# Patient Record
Sex: Female | Born: 1960 | Race: White | Hispanic: No | Smoking: Former smoker
Health system: Southern US, Community
[De-identification: ages and names within clinical notes are randomized; demographics above are authoritative.]

## PROBLEM LIST (undated history)

## (undated) DIAGNOSIS — Z860100 Personal history of colon polyps, unspecified: Secondary | ICD-10-CM

## (undated) DIAGNOSIS — F329 Major depressive disorder, single episode, unspecified: Secondary | ICD-10-CM

## (undated) DIAGNOSIS — H269 Unspecified cataract: Secondary | ICD-10-CM

## (undated) DIAGNOSIS — J302 Other seasonal allergic rhinitis: Secondary | ICD-10-CM

## (undated) DIAGNOSIS — K9041 Non-celiac gluten sensitivity: Secondary | ICD-10-CM

## (undated) DIAGNOSIS — T7840XA Allergy, unspecified, initial encounter: Secondary | ICD-10-CM

## (undated) DIAGNOSIS — B019 Varicella without complication: Secondary | ICD-10-CM

## (undated) HISTORY — DX: Non-celiac gluten sensitivity: K90.41

## (undated) HISTORY — DX: Other seasonal allergic rhinitis: J30.2

## (undated) HISTORY — DX: Allergy, unspecified, initial encounter: T78.40XA

## (undated) HISTORY — DX: Unspecified cataract: H26.9

## (undated) HISTORY — DX: Personal history of colon polyps, unspecified: Z86.0100

## (undated) HISTORY — PX: CATARACT EXTRACTION, BILATERAL: SHX1313

## (undated) HISTORY — PX: BREAST ENHANCEMENT SURGERY: SHX7

## (undated) HISTORY — PX: ABDOMINOPLASTY: SUR9

## (undated) HISTORY — DX: Major depressive disorder, single episode, unspecified: F32.9

## (undated) HISTORY — DX: Varicella without complication: B01.9

## (undated) HISTORY — DX: Personal history of colonic polyps: Z86.010

## (undated) HISTORY — PX: COLONOSCOPY: SHX174

---

## 1998-09-18 ENCOUNTER — Other Ambulatory Visit: Admission: RE | Admit: 1998-09-18 | Discharge: 1998-09-18 | Payer: Self-pay | Admitting: Obstetrics & Gynecology

## 1999-10-01 ENCOUNTER — Other Ambulatory Visit: Admission: RE | Admit: 1999-10-01 | Discharge: 1999-10-01 | Payer: Self-pay | Admitting: Obstetrics & Gynecology

## 2000-09-08 ENCOUNTER — Other Ambulatory Visit: Admission: RE | Admit: 2000-09-08 | Discharge: 2000-09-08 | Payer: Self-pay | Admitting: Obstetrics and Gynecology

## 2001-06-14 HISTORY — PX: PLACEMENT OF BREAST IMPLANTS: SHX6334

## 2001-09-08 ENCOUNTER — Other Ambulatory Visit: Admission: RE | Admit: 2001-09-08 | Discharge: 2001-09-08 | Payer: Self-pay | Admitting: Obstetrics and Gynecology

## 2002-09-13 ENCOUNTER — Other Ambulatory Visit: Admission: RE | Admit: 2002-09-13 | Discharge: 2002-09-13 | Payer: Self-pay | Admitting: Obstetrics and Gynecology

## 2003-06-15 DIAGNOSIS — F32A Depression, unspecified: Secondary | ICD-10-CM

## 2003-06-15 HISTORY — DX: Depression, unspecified: F32.A

## 2003-09-20 ENCOUNTER — Other Ambulatory Visit: Admission: RE | Admit: 2003-09-20 | Discharge: 2003-09-20 | Payer: Self-pay | Admitting: Obstetrics and Gynecology

## 2004-08-21 ENCOUNTER — Other Ambulatory Visit: Admission: RE | Admit: 2004-08-21 | Discharge: 2004-08-21 | Payer: Self-pay | Admitting: Obstetrics and Gynecology

## 2005-08-20 ENCOUNTER — Other Ambulatory Visit: Admission: RE | Admit: 2005-08-20 | Discharge: 2005-08-20 | Payer: Self-pay | Admitting: Obstetrics and Gynecology

## 2009-01-12 DIAGNOSIS — N870 Mild cervical dysplasia: Secondary | ICD-10-CM | POA: Insufficient documentation

## 2009-06-14 DIAGNOSIS — H269 Unspecified cataract: Secondary | ICD-10-CM

## 2009-06-14 HISTORY — DX: Unspecified cataract: H26.9

## 2009-06-14 HISTORY — PX: MOUTH SURGERY: SHX715

## 2012-11-21 ENCOUNTER — Ambulatory Visit: Payer: Self-pay | Admitting: Physician Assistant

## 2012-11-21 VITALS — BP 112/73 | HR 69 | Temp 98.0°F | Resp 16 | Ht 65.0 in | Wt 185.0 lb

## 2012-11-21 DIAGNOSIS — Z7189 Other specified counseling: Secondary | ICD-10-CM

## 2012-11-21 DIAGNOSIS — Z7184 Encounter for health counseling related to travel: Secondary | ICD-10-CM

## 2012-11-21 MED ORDER — ATOVAQUONE-PROGUANIL HCL 250-100 MG PO TABS: 1.0000 | ORAL_TABLET | Freq: Every day | ORAL | Status: DC

## 2012-11-21 NOTE — Progress Notes (Signed)
  Subjective:    Patient ID: Maureen Butler, female    DOB: 1960/06/26, 52 y.o.   MRN: [id number]  HPI 52 year old female presents for counseling for international travel. Leaving on 11/25/12 for 1 week in [place] for a medical mission trip. They have been on multiple mission trips in the past and have had Hepatitis A and Hepatitis B and typhoid in the past.  Patient otherwise healthy with no other concerns today.      Review of Systems  Constitutional: Negative for fever and chills.  Respiratory: Negative for cough.   Gastrointestinal: Negative for nausea and vomiting.       Objective:   Physical Exam  Constitutional: She is oriented to person, place, and time. She appears well-developed and well-nourished.  HENT:  Head: Normocephalic and atraumatic.  Right Ear: External ear normal.  Left Ear: External ear normal.  Eyes: Conjunctivae are normal.  Neck: Normal range of motion.  Cardiovascular: Normal rate.   Pulmonary/Chest: Effort normal.  Neurological: She is alert and oriented to person, place, and time.  Psychiatric: She has a normal mood and affect. Her behavior is normal. Judgment and thought content normal.          Assessment & Plan:  Encounter for counseling for travel  Rx for Malarone provided to start on 11/24/12, take daily while in [place], then for 7 days upon return Follow up as needed.

## 2013-09-11 ENCOUNTER — Other Ambulatory Visit: Payer: Self-pay

## 2013-09-11 DIAGNOSIS — Z1231 Encounter for screening mammogram for malignant neoplasm of breast: Secondary | ICD-10-CM

## 2013-10-01 ENCOUNTER — Ambulatory Visit
Admission: RE | Admit: 2013-10-01 | Discharge: 2013-10-01 | Disposition: A | Discharge status: Home / Self Care | Payer: 59 | Source: Ambulatory Visit

## 2013-10-01 DIAGNOSIS — Z1231 Encounter for screening mammogram for malignant neoplasm of breast: Secondary | ICD-10-CM

## 2014-08-08 ENCOUNTER — Ambulatory Visit (INDEPENDENT_AMBULATORY_CARE_PROVIDER_SITE_OTHER): Payer: No Typology Code available for payment source | Admitting: Emergency Medicine

## 2014-08-08 ENCOUNTER — Ambulatory Visit (INDEPENDENT_AMBULATORY_CARE_PROVIDER_SITE_OTHER): Payer: No Typology Code available for payment source

## 2014-08-08 VITALS — BP 110/66 | HR 56 | Temp 97.6°F | Resp 18 | Ht 65.0 in | Wt 171.0 lb

## 2014-08-08 DIAGNOSIS — M25572 Pain in left ankle and joints of left foot: Secondary | ICD-10-CM

## 2014-08-08 DIAGNOSIS — S93402A Sprain of unspecified ligament of left ankle, initial encounter: Secondary | ICD-10-CM | POA: Diagnosis not present

## 2014-08-08 MED ORDER — NAPROXEN SODIUM 550 MG PO TABS: 550.0000 mg | ORAL_TABLET | Freq: Two times a day (BID) | ORAL | Status: AC

## 2014-08-08 MED ORDER — HYDROCODONE-ACETAMINOPHEN 5-325 MG PO TABS: 1.0000 | ORAL_TABLET | ORAL | Status: DC | PRN

## 2014-08-08 NOTE — Patient Instructions (Signed)

## 2014-08-08 NOTE — Progress Notes (Signed)
[facility] [address] [place] [address] [phone]  Date:  08/08/2014   Name:  Maureen Butler   DOB:  [birth year]   MRN:  [id number]  PCP:  No PCP Per Patient    Chief Complaint: Ankle Injury   History of Present Illness:  Maureen Butler is a 54 y.o. very pleasant female patient who presents with the following:  Injured yesterday in slip on wet grass.  Twisted her ankle and has pain in medial ankle Worse with weight bearing and inversion Less with elevation and ice. No improvement with over the counter medications or other home remedies.  Denies other complaint or health concern today.   There are no active problems to display for this patient.   Past Medical History  Diagnosis Date  . Cataract     Past Surgical History  Procedure Laterality Date  . Cesarean section      History  Substance Use Topics  . Smoking status: Never Smoker   . Smokeless tobacco: Not on file  . Alcohol Use: No    Family History  Problem Relation Age of Onset  . Alcohol abuse Sister     No Known Allergies  Medication list has been reviewed and updated.  Current Outpatient Prescriptions on File Prior to Visit  Medication Sig Dispense Refill  . sertraline (ZOLOFT) 100 MG tablet Take 100 mg by mouth daily.    Marland Kitchen atovaquone-proguanil (MALARONE) 250-100 MG TABS Take 1 tablet by mouth daily. (Patient not taking: Reported on 08/08/2014) 16 tablet 0   No current facility-administered medications on file prior to visit.    Review of Systems:  As per HPI, otherwise negative.    Physical Examination: Filed Vitals:   08/08/14 1434  BP: 110/66  Pulse: 56  Temp: 97.6 F (36.4 C)  Resp: 18   Filed Vitals:   08/08/14 1434  Height: 5\' 5"  (1.651 m)  Weight: 171 lb (77.565 kg)   Body mass index is 28.46 kg/(m^2). Ideal Body Weight: Weight in (lb) to have BMI = 25: 149.9   GEN: WDWN, NAD, Non-toxic, Alert & Oriented x 3 HEENT: Atraumatic, Normocephalic.   Ears and Nose: No external deformity. EXTR: No clubbing/cyanosis/edema NEURO: Normal gait.  PSYCH: Normally interactive. Conversant. Not depressed or anxious appearing.  Calm demeanor.  Left ankle:  Tender and ecchymotic medial malleolus and midfoot.  No deformity.  Joint stable  Assessment and Plan: Sprain ankle Crutches WBAT Ankle brace vicodin Anaprox  Signed,  Ellison Carwin, MD   [facility] reading (PRIMARY) by  Dr. Ouida Sills.  Negative foot and ankle.

## 2014-12-10 ENCOUNTER — Ambulatory Visit (INDEPENDENT_AMBULATORY_CARE_PROVIDER_SITE_OTHER): Payer: No Typology Code available for payment source | Admitting: Family Medicine

## 2014-12-10 VITALS — BP 108/64 | HR 57 | Temp 97.8°F | Resp 16 | Ht 64.25 in | Wt 169.0 lb

## 2014-12-10 DIAGNOSIS — R1084 Generalized abdominal pain: Secondary | ICD-10-CM | POA: Diagnosis not present

## 2014-12-10 DIAGNOSIS — R197 Diarrhea, unspecified: Secondary | ICD-10-CM

## 2014-12-10 DIAGNOSIS — T753XXA Motion sickness, initial encounter: Secondary | ICD-10-CM

## 2014-12-10 LAB — POCT URINALYSIS DIPSTICK
Bilirubin, UA: NEGATIVE
Blood, UA: NEGATIVE
Glucose, UA: NEGATIVE
Ketones, UA: NEGATIVE
Leukocytes, UA: NEGATIVE
Nitrite, UA: NEGATIVE
PH UA: 5.5
Spec Grav, UA: 1.025
Urobilinogen, UA: 0.2

## 2014-12-10 LAB — POCT CBC
Granulocyte percent: 56.3 %G (ref 37–80)
HCT, POC: 43.5 % (ref 37.7–47.9)
HEMOGLOBIN: 14.1 g/dL (ref 12.2–16.2)
Lymph, poc: 2.4 (ref 0.6–3.4)
MCH: 28.9 pg (ref 27–31.2)
MCHC: 32.5 g/dL (ref 31.8–35.4)
MCV: 89.1 fL (ref 80–97)
MID (CBC): 0.6 (ref 0–0.9)
MPV: 6.4 fL (ref 0–99.8)
POC GRANULOCYTE: 3.9 (ref 2–6.9)
POC LYMPH %: 35.5 % (ref 10–50)
POC MID %: 8.2 % (ref 0–12)
Platelet Count, POC: 348 10*3/uL (ref 142–424)
RBC: 4.89 M/uL (ref 4.04–5.48)
RDW, POC: 12.7 %
WBC: 6.9 10*3/uL (ref 4.6–10.2)

## 2014-12-10 LAB — POCT UA - MICROSCOPIC ONLY
CASTS, UR, LPF, POC: NEGATIVE
CRYSTALS, UR, HPF, POC: NEGATIVE
MUCUS UA: NEGATIVE
YEAST UA: NEGATIVE

## 2014-12-10 MED ORDER — METRONIDAZOLE 500 MG PO TABS: 500.0000 mg | ORAL_TABLET | Freq: Three times a day (TID) | ORAL | Status: DC

## 2014-12-10 NOTE — Progress Notes (Signed)
Subjective:  Patient ID: Maureen Butler, female    DOB: [birth year]  Age: 54 y.o. MRN: [id number]  54 year old lady who 5 weeks ago was on a cruise around the [place]. She did not eat much of anything on land. The day before the cruise ended she developed diarrhea. She eventually took a course of ciprofloxacin after a couple of weeks, and the diarrhea cleared for that week. Shortly thereafter she redeveloped diarrhea, starting gradually and getting worse. Last night was very bad. She has had a lot of abdominal bloating and watery diarrhea. No fever. No other generalized illness. No cardiorespiratory symptoms. No GU symptoms. She is postmenopausal. No musculoskeletal or rash problems.  She does not know if anyone else from the cruise having symptoms, but really doesn't know of this from the cruise. She works as a Production designer, theatre/television/film at [employer]. Her partner has not been ill.  She takes some probiotics and around the course did take some Pepto-Bismol but didn't seem to help. She does not have a history of diarrheal disease, though she always moves her bowels on a frequent basis post eating. She is on sertraline.   Objective:   Pleasant alert healthy appearing lady. Throat clear. Neck without nodes. Chest clear. Heart regular without murmurs. Abdomen has relatively normal bowel sounds. Does not is up. Grossly distended at this time. Soft without organomegaly, masses, or significant tenderness morning. No rashes.  Assessment & Plan:   Assessment: Travel illness with post trip diarrhea, now recurrent with abdominal pain  Plan: CBC, urinalysis, stool culture, C. Difficile, giardia  Results for orders placed or performed in visit on 12/10/14  POCT CBC  Result Value Ref Range   WBC 6.9 4.6 - 10.2 K/uL   Lymph, poc 2.4 0.6 - 3.4   POC LYMPH PERCENT 35.5 10 - 50 %L   MID (cbc) 0.6 0 - 0.9   POC MID % 8.2 0 - 12 %M   POC Granulocyte 3.9 2 - 6.9   Granulocyte percent 56.3 37 - 80 %G   RBC 4.89  4.04 - 5.48 M/uL   Hemoglobin 14.1 12.2 - 16.2 g/dL   HCT, POC 43.5 37.7 - 47.9 %   MCV 89.1 80 - 97 fL   MCH, POC 28.9 27 - 31.2 pg   MCHC 32.5 31.8 - 35.4 g/dL   RDW, POC 12.7 %   Platelet Count, POC 348 142 - 424 K/uL   MPV 6.4 0 - 99.8 fL  POCT UA - Microscopic Only  Result Value Ref Range   WBC, Ur, HPF, POC 0-2    RBC, urine, microscopic 0-2    Bacteria, U Microscopic trace    Mucus, UA neg    Epithelial cells, urine per micros 1-4    Crystals, Ur, HPF, POC neg    Casts, Ur, LPF, POC neg    Yeast, UA neg   POCT urinalysis dipstick  Result Value Ref Range   Color, UA yellow    Clarity, UA clear    Glucose, UA neg    Bilirubin, UA neg    Ketones, UA neg    Spec Grav, UA 1.025    Blood, UA neg    pH, UA 5.5    Protein, UA trace    Urobilinogen, UA 0.2    Nitrite, UA neg    Leukocytes, UA Negative Negative   Patient was told that there are many things that could cause of diarrhea like this after traveling. Her symptoms sound suspicious for  Giardia, and will treat with metronidazole which would also help probably with C. difficile or amoeba. She is arty had a course of ciprofloxacin. If she is not doing better in the next week she is to return. Cautioned her about alcohol and metronidazole. Patient Instructions  Stay well hydrated  Take the metronidazole 500 mg 3 times daily with food  If diarrhea has not improved some in the next 48 hours, then you can go ahead and begin some over-the-counter Imodium also. Continue taking your probiotics.  At anytime if he developed bloody diarrhea or increasing abdominal pain she should come in to be checked sooner.  If you're not doing well over the next week or so advise coming back.     Ellison Leisure, MD 12/10/2014

## 2014-12-10 NOTE — Patient Instructions (Signed)
Stay well hydrated  Take the metronidazole 500 mg 3 times daily with food  If diarrhea has not improved some in the next 48 hours, then you can go ahead and begin some over-the-counter Imodium also. Continue taking your probiotics.  At anytime if he developed bloody diarrhea or increasing abdominal pain she should come in to be checked sooner.  If you're not doing well over the next week or so advise coming back.

## 2014-12-11 ENCOUNTER — Telehealth: Payer: Self-pay | Admitting: Radiology

## 2014-12-11 LAB — GIARDIA/CRYPTOSPORIDIUM (EIA)
Cryptosporidium Screen (EIA): NEGATIVE
Giardia Screen (EIA): NEGATIVE

## 2014-12-11 NOTE — Telephone Encounter (Signed)
Dr hopper- [facility] called--they were unable to do C-diff due to the stool turned in by the pt was formed, and they need it to be loose. Let us know if you would like [place] to do anything.

## 2014-12-12 ENCOUNTER — Telehealth: Payer: Self-pay | Admitting: Family Medicine

## 2014-12-12 NOTE — Telephone Encounter (Signed)
Tell patient if watery diarrhea recurs will need to re-submit a C-dif.

## 2014-12-12 NOTE — Telephone Encounter (Signed)
lmom for patient to give lab a call back

## 2014-12-14 LAB — STOOL CULTURE

## 2015-01-08 ENCOUNTER — Other Ambulatory Visit: Payer: Self-pay | Admitting: Emergency Medicine

## 2015-01-08 ENCOUNTER — Ambulatory Visit (INDEPENDENT_AMBULATORY_CARE_PROVIDER_SITE_OTHER): Payer: No Typology Code available for payment source | Admitting: Emergency Medicine

## 2015-01-08 VITALS — BP 114/68 | HR 68 | Temp 98.9°F | Resp 18 | Ht 64.0 in | Wt 173.0 lb

## 2015-01-08 DIAGNOSIS — A09 Infectious gastroenteritis and colitis, unspecified: Secondary | ICD-10-CM | POA: Diagnosis not present

## 2015-01-08 LAB — POCT CBC
GRANULOCYTE PERCENT: 49.8 % (ref 37–80)
HCT, POC: 41.7 % (ref 37.7–47.9)
HEMOGLOBIN: 14.1 g/dL (ref 12.2–16.2)
Lymph, poc: 3.4 (ref 0.6–3.4)
MCH, POC: 30 pg (ref 27–31.2)
MCHC: 33.8 g/dL (ref 31.8–35.4)
MCV: 88.5 fL (ref 80–97)
MID (CBC): 0.6 (ref 0–0.9)
MPV: 6.5 fL (ref 0–99.8)
PLATELET COUNT, POC: 323 10*3/uL (ref 142–424)
POC GRANULOCYTE: 3.9 (ref 2–6.9)
POC LYMPH PERCENT: 43.1 %L (ref 10–50)
POC MID %: 7.1 %M (ref 0–12)
RBC: 4.71 M/uL (ref 4.04–5.48)
RDW, POC: 12.9 %
WBC: 7.8 10*3/uL (ref 4.6–10.2)

## 2015-01-08 MED ORDER — CIPROFLOXACIN HCL 500 MG PO TABS: 500.0000 mg | ORAL_TABLET | Freq: Two times a day (BID) | ORAL | Status: DC

## 2015-01-08 NOTE — Progress Notes (Signed)
Subjective:  Patient ID: Maureen Butler, female    DOB: [birth year]  Age: 54 y.o. MRN: [id number]  CC: Follow-up   HPI Maureen Butler presents   With diarrhea. 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 She previous to that she was treated for diarrhea in June by Dr. Linna Darner after a trip to the [place] on a cruise ship. She was treated initially with Cipro and failed to improve then given Flagyl. She again improved well enough that she could travel to [place]. She had negative stool culture and stool ova and parasite examinations done at that time. She has no fever or chills she has some moderate pus in her stool denies any specific abdominal pain and cramps. She has no nausea or vomiting. She's not seen any visible parasites. She is very meticulous in her travels to avoid risk of fecal oral contamination. She's had no improvement with over-the-counter medication  History Maureen Butler has a past medical history of Cataract.   She has past surgical history that includes Cesarean section.   Her  family history includes Alcohol abuse in her sister.  She   reports that she has quit smoking. She does not have any smokeless tobacco history on file. She reports that she does not drink alcohol or use illicit drugs.  Outpatient Prescriptions Prior to Visit  Medication Sig Dispense Refill  . sertraline (ZOLOFT) 100 MG tablet Take 100 mg by mouth daily.    Maureen Butler Kitchen atovaquone-proguanil (MALARONE) 250-100 MG TABS Take 1 tablet by mouth daily. (Patient not taking: Reported on 08/08/2014) 16 tablet 0  . HYDROcodone-acetaminophen (NORCO) 5-325 MG per tablet Take 1-2 tablets by mouth every 4 (four) hours as needed. (Patient not taking: Reported on 12/10/2014) 30 tablet 0  . metroNIDAZOLE (FLAGYL) 500 MG tablet Take 1 tablet (500 mg total) by mouth 3 (three) times daily. (Patient not taking: Reported on 01/08/2015) 21 tablet 0  . naproxen sodium (ANAPROX DS) 550 MG tablet Take 1 tablet (550  mg total) by mouth 2 (two) times daily with a meal. (Patient not taking: Reported on 12/10/2014) 40 tablet 0   No facility-administered medications prior to visit.    History   Social History  . Marital Status: Married    Spouse Name: N/A  . Number of Children: N/A  . Years of Education: N/A   Social History Main Topics  . Smoking status: Former Research scientist (life sciences)  . Smokeless tobacco: Not on file  . Alcohol Use: No  . Drug Use: No  . Sexual Activity: Yes    Birth Control/ Protection: Abstinence   Other Topics Concern  . None   Social History Narrative     Review of Systems  Constitutional: Negative for fever, chills and appetite change.  HENT: Negative for congestion, ear pain, postnasal drip, sinus pressure and sore throat.   Eyes: Negative for pain and redness.  Respiratory: Negative for cough, shortness of breath and wheezing.   Cardiovascular: Negative for leg swelling.  Gastrointestinal: Negative for nausea, vomiting, abdominal pain, diarrhea, constipation and blood in stool.  Endocrine: Negative for polyuria.  Genitourinary: Negative for dysuria, urgency, frequency and flank pain.  Musculoskeletal: Negative for gait problem.  Skin: Negative for rash.  Neurological: Negative for weakness and headaches.  Psychiatric/Behavioral: Negative for confusion and decreased concentration. The patient is not nervous/anxious.     Objective:  BP 114/68 mmHg  Pulse 68  Temp(Src) 98.9 F (37.2 C)  Resp 18  Ht 5\' 4"  (1.626 m)  Wt  173 lb (78.472 kg)  BMI 29.68 kg/m2  SpO2 98%  Physical Exam  Constitutional: She is oriented to person, place, and time. She appears well-developed and well-nourished.  HENT:  Head: Normocephalic and atraumatic.  Eyes: Conjunctivae are normal. Pupils are equal, round, and reactive to light.  Pulmonary/Chest: Effort normal.  Musculoskeletal: She exhibits no edema.  Neurological: She is alert and oriented to person, place, and time.  Skin: Skin is dry.    Psychiatric: She has a normal mood and affect. Her behavior is normal. Thought content normal.      Assessment & Plan:   Sareen was seen today for follow-up.  Diagnoses and all orders for this visit:  Diarrhea, travelers' Orders: -     POCT CBC -     Ova and parasite examination -     Stool culture -     Ova and parasite examination -     Ova and parasite examination -     Clostridium difficile EIA -     Ambulatory referral to Gastroenterology  Other orders -     ciprofloxacin (CIPRO) 500 MG tablet; Take 1 tablet (500 mg total) by mouth 2 (two) times daily.   I am having [name] start on ciprofloxacin. I am also having her maintain her sertraline, atovaquone-proguanil, HYDROcodone-acetaminophen, naproxen sodium, and metroNIDAZOLE.  Meds ordered this encounter  Medications  . ciprofloxacin (CIPRO) 500 MG tablet    Sig: Take 1 tablet (500 mg total) by mouth 2 (two) times daily.    Dispense:  20 tablet    Refill:  0    I suggested to her that we repeat her ova parasites and stool culture get an C. Difficile titer and she's been in and been on antibiotic. And she's not had a colonoscopy and with his chronic recurrent diarrhea think is appropriate she be seen by by gastroenterologist and that referral is made.  Appropriate red flag conditions were discussed with the patient as well as actions that should be taken.  Patient expressed his understanding.  Follow-up: Return if symptoms worsen or fail to improve.  Roselee Culver, MD

## 2015-01-08 NOTE — Patient Instructions (Signed)

## 2015-01-09 LAB — C. DIFFICILE GDH AND TOXIN A/B
C. difficile GDH: NOT DETECTED
C. difficile Toxin A/B: NOT DETECTED

## 2015-01-10 LAB — OVA AND PARASITE EXAMINATION
OP: NONE SEEN
OP: NONE SEEN
OP: NONE SEEN

## 2015-01-13 LAB — STOOL CULTURE

## 2015-03-30 IMAGING — CR DG ANKLE COMPLETE 3+V*L*
3 series · 3 of 3 positions shown · non-contrast
Comparison: None.

CLINICAL DATA: Twisted ankle walking in the grass

EXAM:
LEFT ANKLE COMPLETE - 3+ VIEW

[AP]
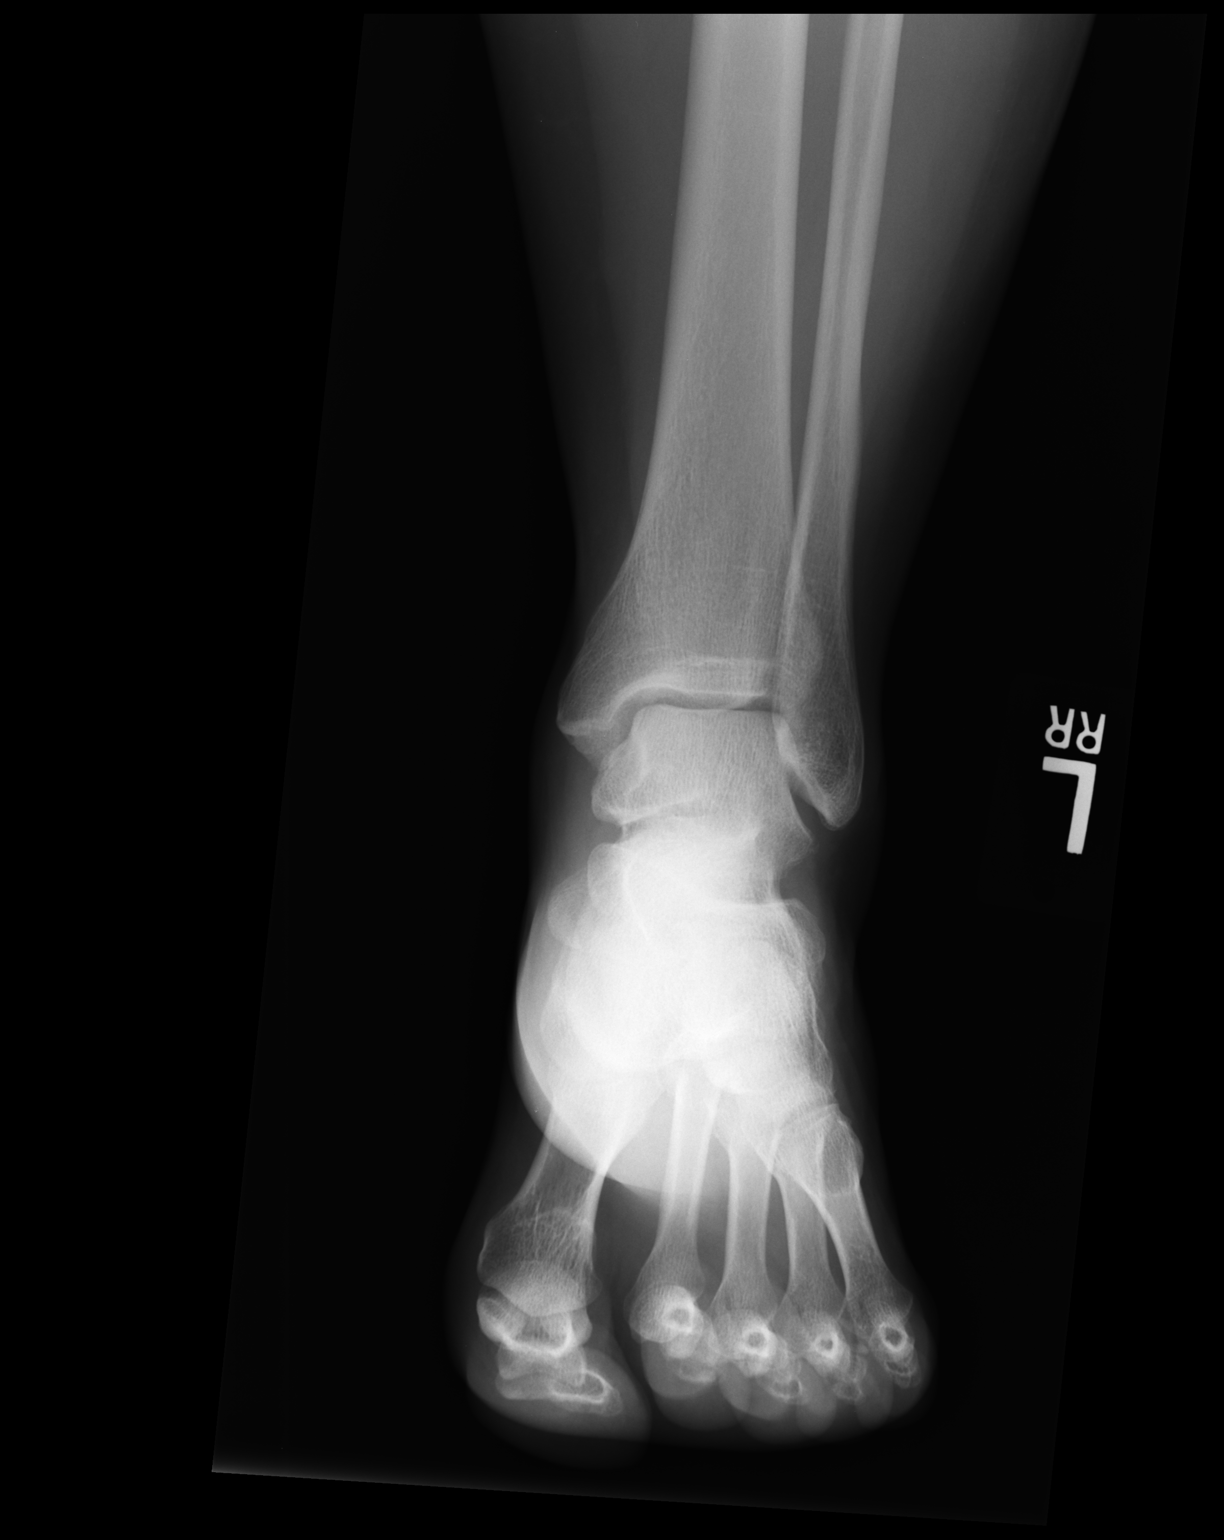

[ap obl int rot]
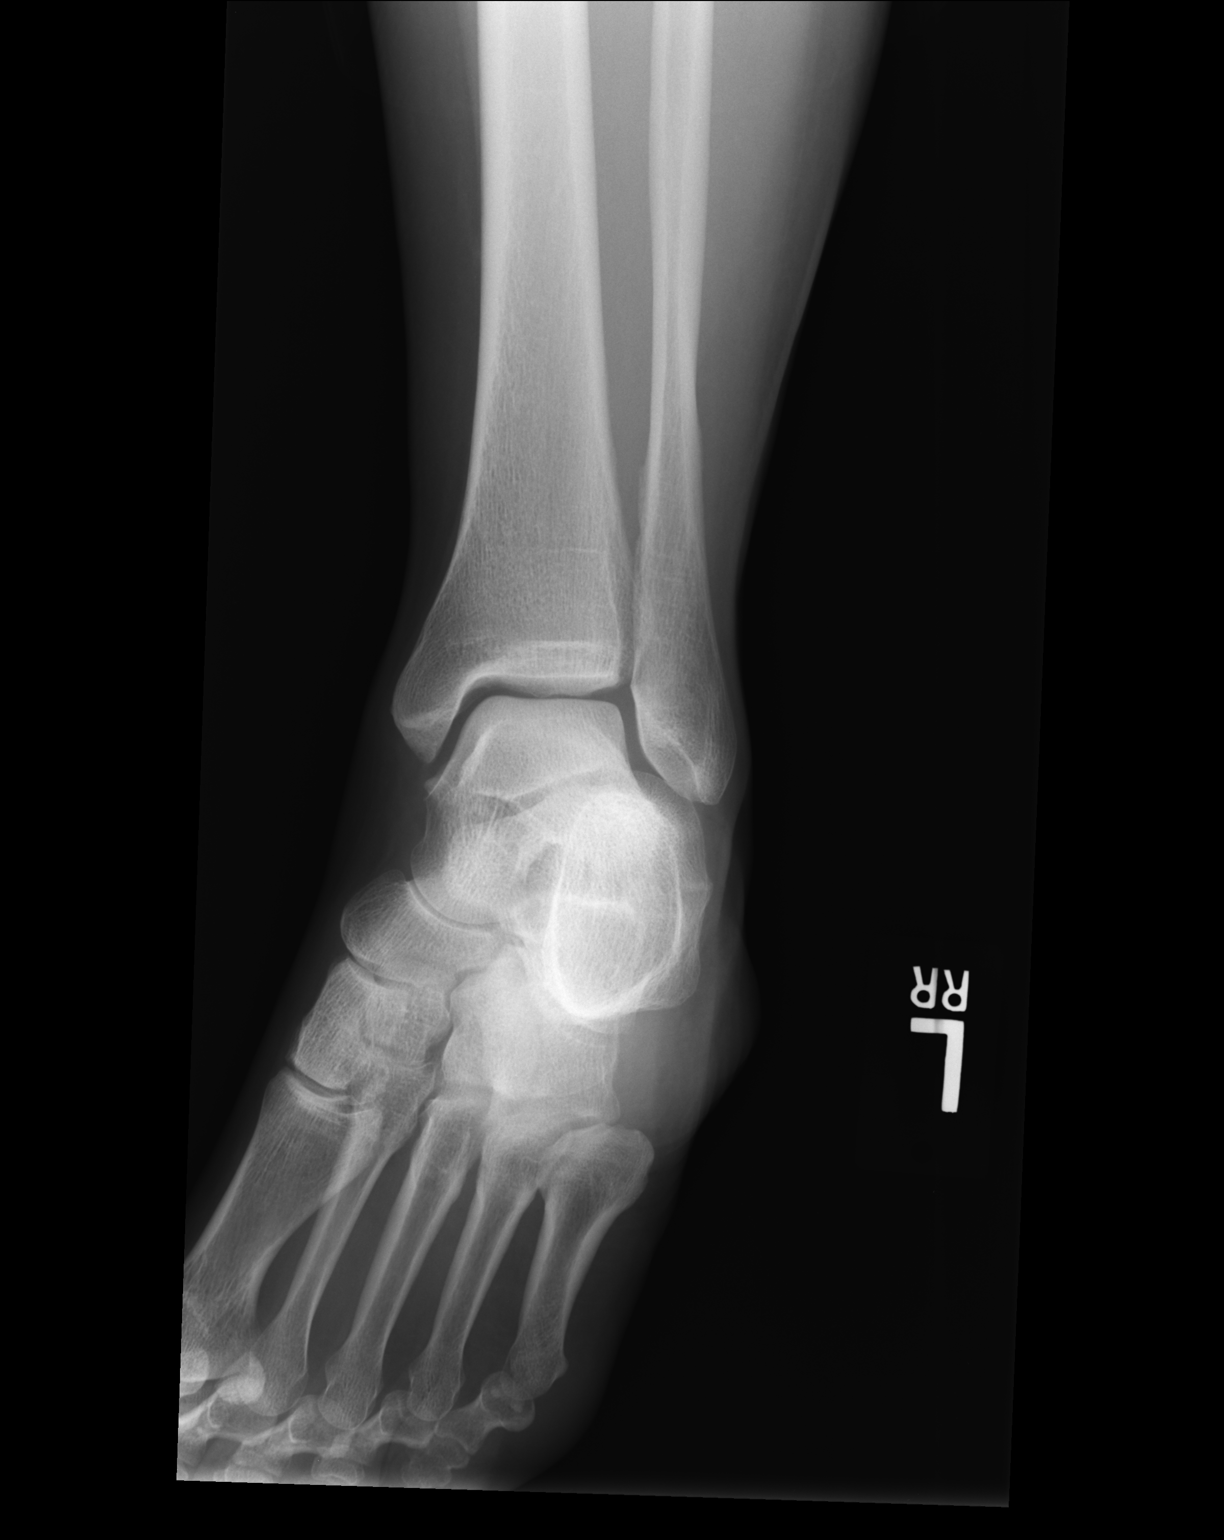

[lateral]
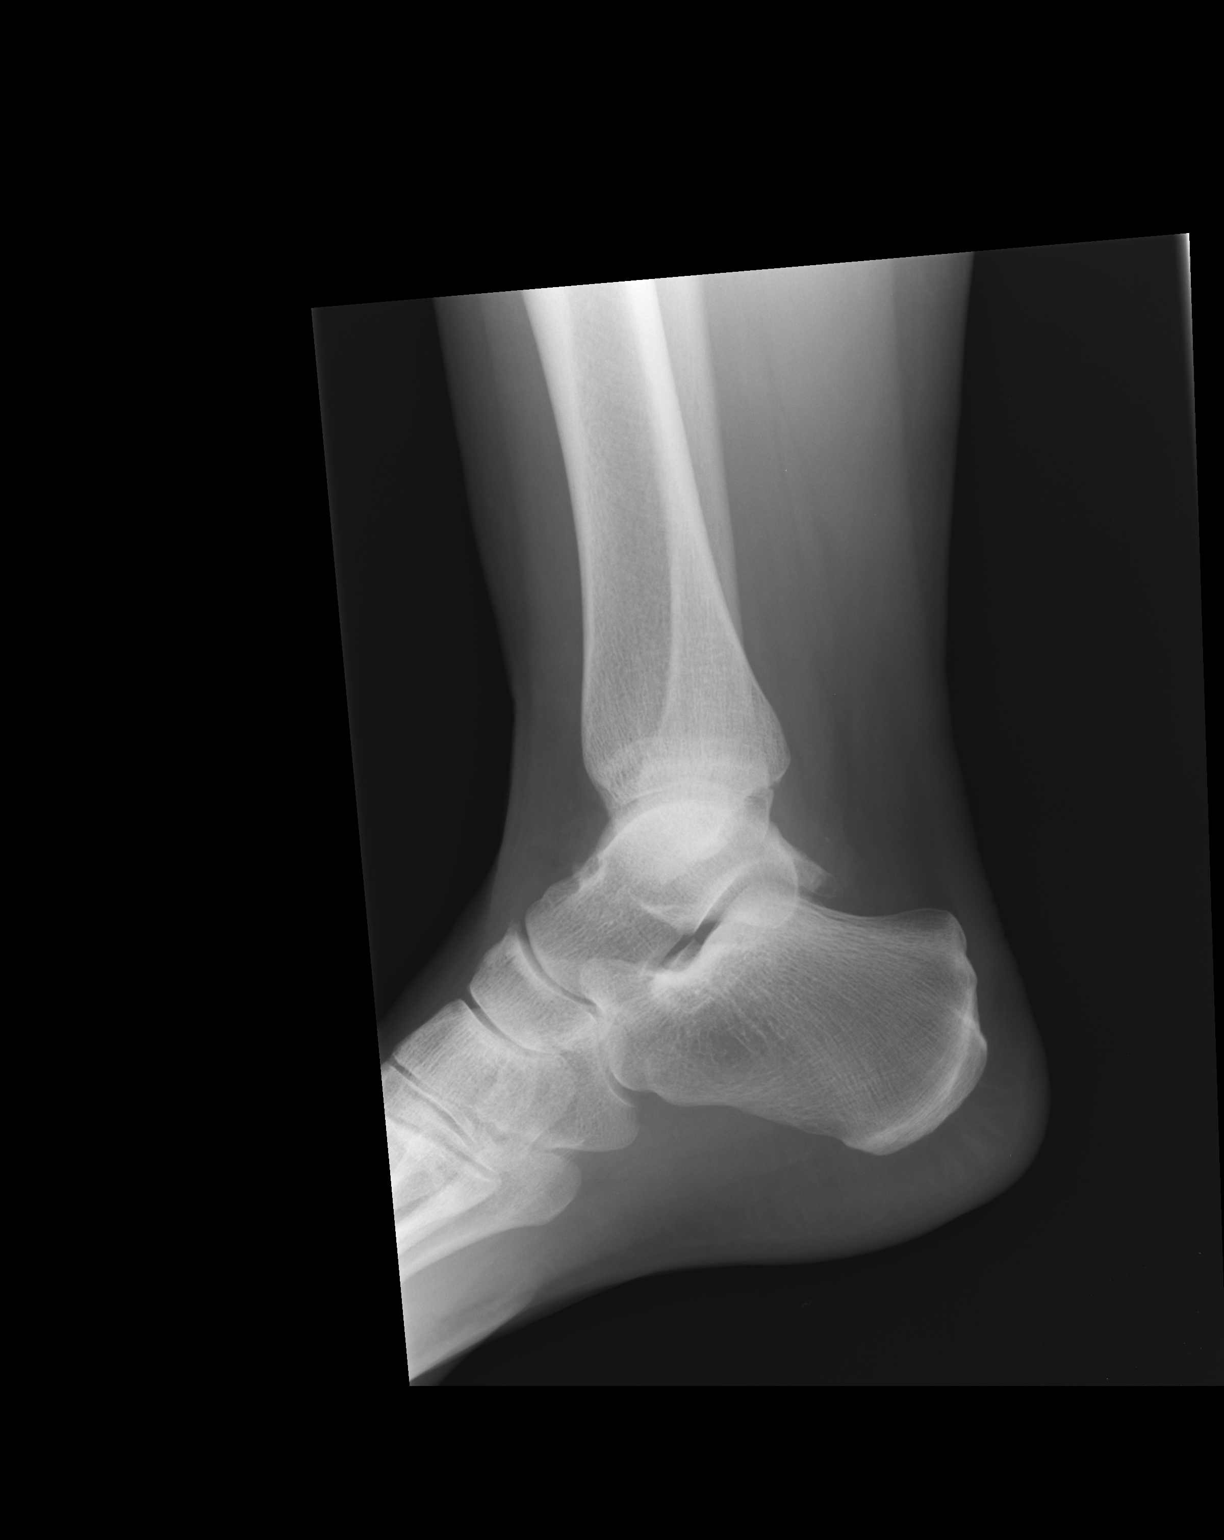

[3 of 3 positions shown; findings below may reference images not displayed]

FINDINGS: Three views of left ankle submitted. No acute fracture or
subluxation. No radiopaque foreign body. Ankle mortise is preserved.
IMPRESSION: Negative.

## 2015-04-16 DIAGNOSIS — Z0271 Encounter for disability determination: Secondary | ICD-10-CM

## 2016-11-04 LAB — HM PAP SMEAR: HM PAP: NEGATIVE

## 2016-11-04 LAB — HM MAMMOGRAPHY

## 2016-11-30 ENCOUNTER — Encounter: Payer: Self-pay | Admitting: Gastroenterology

## 2017-01-06 ENCOUNTER — Ambulatory Visit (AMBULATORY_SURGERY_CENTER): Payer: Self-pay | Admitting: *Deleted

## 2017-01-06 VITALS — Ht 64.0 in | Wt 179.0 lb

## 2017-01-06 DIAGNOSIS — Z1211 Encounter for screening for malignant neoplasm of colon: Secondary | ICD-10-CM

## 2017-01-06 MED ORDER — NA SULFATE-K SULFATE-MG SULF 17.5-3.13-1.6 GM/177ML PO SOLN: 1.0000 | Freq: Once | ORAL | 0 refills | Status: AC

## 2017-01-06 NOTE — Progress Notes (Signed)
Denies allergies to eggs or soy products. Denies complications with sedation or anesthesia. Denies O2 use. Denies use of diet or weight loss medications.  Emmi instructions given for colonoscopy.  

## 2017-01-20 ENCOUNTER — Ambulatory Visit (AMBULATORY_SURGERY_CENTER): Payer: PRIVATE HEALTH INSURANCE | Admitting: Gastroenterology

## 2017-01-20 ENCOUNTER — Encounter: Payer: Self-pay | Admitting: Gastroenterology

## 2017-01-20 VITALS — BP 103/56 | HR 48 | Temp 98.0°F | Resp 11 | Ht 64.0 in | Wt 173.0 lb

## 2017-01-20 DIAGNOSIS — Z800 Family history of malignant neoplasm of digestive organs: Secondary | ICD-10-CM

## 2017-01-20 DIAGNOSIS — Z1211 Encounter for screening for malignant neoplasm of colon: Secondary | ICD-10-CM | POA: Diagnosis not present

## 2017-01-20 DIAGNOSIS — Z1212 Encounter for screening for malignant neoplasm of rectum: Secondary | ICD-10-CM

## 2017-01-20 DIAGNOSIS — D123 Benign neoplasm of transverse colon: Secondary | ICD-10-CM | POA: Diagnosis not present

## 2017-01-20 MED ORDER — SODIUM CHLORIDE 0.9 % IV SOLN: 500.0000 mL | INTRAVENOUS | Status: AC

## 2017-01-20 NOTE — Progress Notes (Signed)
Pt's states no medical or surgical changes since previsit or office visit. maw 

## 2017-01-20 NOTE — Progress Notes (Signed)
Report to PACU, RN, vss, BBS= Clear.  

## 2017-01-20 NOTE — Progress Notes (Signed)
Called to room to assist during endoscopic procedure.  Patient ID and intended procedure confirmed with present staff. Received instructions for my participation in the procedure from the performing physician.  

## 2017-01-20 NOTE — Patient Instructions (Signed)
YOU HAD AN ENDOSCOPIC PROCEDURE TODAY AT [facility]:   Refer to the procedure report that was given to you for any specific questions about what was found during the examination.  If the procedure report does not answer your questions, please call your gastroenterologist to clarify.  If you requested that your care partner not be given the details of your procedure findings, then the procedure report has been included in a sealed envelope for you to review at your convenience later.  YOU SHOULD EXPECT: Some feelings of bloating in the abdomen. Passage of more gas than usual.  Walking can help get rid of the air that was put into your GI tract during the procedure and reduce the bloating. If you had a lower endoscopy (such as a colonoscopy or flexible sigmoidoscopy) you may notice spotting of blood in your stool or on the toilet paper. If you underwent a bowel prep for your procedure, you may not have a normal bowel movement for a few days.  Please Note:  You might notice some irritation and congestion in your nose or some drainage.  This is from the oxygen used during your procedure.  There is no need for concern and it should clear up in a day or so.  SYMPTOMS TO REPORT IMMEDIATELY:   Following lower endoscopy (colonoscopy or flexible sigmoidoscopy):  Excessive amounts of blood in the stool  Significant tenderness or worsening of abdominal pains  Swelling of the abdomen that is new, acute  Fever of 100F or higher   For urgent or emergent issues, a gastroenterologist can be reached at any hour by calling [phone].   DIET:  We do recommend a small meal at first, but then you may proceed to your regular diet.  Drink plenty of fluids but you should avoid alcoholic beverages for 24 hours.  ACTIVITY:  You should plan to take it easy for the rest of today and you should NOT DRIVE or use heavy machinery until tomorrow (because of the sedation medicines used during the test).     FOLLOW UP: Our staff will call the number listed on your records the next business day following your procedure to check on you and address any questions or concerns that you may have regarding the information given to you following your procedure. If we do not reach you, we will leave a message.  However, if you are feeling well and you are not experiencing any problems, there is no need to return our call.  We will assume that you have returned to your regular daily activities without incident.  If any biopsies were taken you will be contacted by phone or by letter within the next 1-3 weeks.  Please call us at [phone] if you have not heard about the biopsies in 3 weeks.    SIGNATURES/CONFIDENTIALITY: You and/or your care partner have signed paperwork which will be entered into your electronic medical record.  These signatures attest to the fact that that the information above on your After Visit Summary has been reviewed and is understood.  Full responsibility of the confidentiality of this discharge information lies with you and/or your care-partner.  No NSAIDS: aspirin, aleve, ibuprofen for two weeks after your procedure per Dr. Loletha Carrow.

## 2017-01-20 NOTE — Op Note (Signed)
[facility] Patient Name: Maureen Butler Procedure Date: 01/20/2017 12:55 PM MRN: [id number] Endoscopist: [place]. Loletha Carrow , MD Age: 56 Referring MD:  Date of Birth: 09-Oct-1960 Gender: Female Account #: [id number] Procedure:                Colonoscopy Indications:              Screening in patient at increased risk: Colorectal                            cancer in mother 82 or older, This is the patient's                            first colonoscopy Medicines:                Monitored Anesthesia Care Procedure:                Pre-Anesthesia Assessment:                           - Prior to the procedure, a History and Physical                            was performed, and patient medications and                            allergies were reviewed. The patient's tolerance of                            previous anesthesia was also reviewed. The risks                            and benefits of the procedure and the sedation                            options and risks were discussed with the patient.                            All questions were answered, and informed consent                            was obtained. Prior Anticoagulants: The patient has                            taken no previous anticoagulant or antiplatelet                            agents. ASA Grade Assessment: II - A patient with                            mild systemic disease. After reviewing the risks                            and benefits, the patient was deemed in  satisfactory condition to undergo the procedure.                           After obtaining informed consent, the colonoscope                            was passed under direct vision. Throughout the                            procedure, the patient's blood pressure, pulse, and                            oxygen saturations were monitored continuously. The                            Colonoscope was introduced through the  anus and                            advanced to the the cecum, identified by                            appendiceal orifice and ileocecal valve. The                            colonoscopy was performed without difficulty. The                            patient tolerated the procedure well. The quality                            of the bowel preparation was excellent. The                            ileocecal valve, appendiceal orifice, and rectum                            were photographed. The quality of the bowel                            preparation was evaluated using the BBPS [facility]                            Bowel Preparation Scale) with scores of: Right                            Colon = 3, Transverse Colon = 3 and Left Colon = 3.                            The total BBPS score equals 9. The bowel                            preparation used was SUPREP. Scope In: 1:13:59 PM Scope Out: 1:31:15 PM Scope Withdrawal Time: 0 hours 13 minutes 43 seconds  Total Procedure Duration: 0 hours 17 minutes 16 seconds  Findings:                 The perianal and digital rectal examinations were                            normal.                           A 12 mm polyp was found in the proximal transverse                            colon. The polyp was sessile. The polyp was removed                            with a hot snare. Resection and retrieval were                            complete.                           The exam was otherwise without abnormality on                            direct and retroflexion views. Complications:            No immediate complications. Estimated Blood Loss:     Estimated blood loss: none. Impression:               - One 12 mm polyp in the proximal transverse colon,                            removed with a hot snare. Resected and retrieved.                           - The examination was otherwise normal on direct                            and retroflexion  views. Recommendation:           - Patient has a contact number available for                            emergencies. The signs and symptoms of potential                            delayed complications were discussed with the                            patient. Return to normal activities tomorrow.                            Written discharge instructions were provided to the                            patient.                           -  Resume previous diet.                           - No aspirin, ibuprofen, naproxen, or other                            non-steroidal anti-inflammatory drugs for 7 days                            after polyp removal.                           - Await pathology results.                           - Repeat colonoscopy is recommended for                            surveillance. The colonoscopy date will be                            determined after pathology results from today's                            exam become available for review.  L. Loletha Carrow, MD 01/20/2017 1:36:17 PM This report has been signed electronically.

## 2017-01-21 ENCOUNTER — Telehealth: Payer: Self-pay

## 2017-01-21 NOTE — Telephone Encounter (Signed)
Name identifier, left a message 2nd call attempt.

## 2017-01-26 ENCOUNTER — Encounter: Payer: Self-pay | Admitting: Gastroenterology

## 2017-02-09 DIAGNOSIS — K635 Polyp of colon: Secondary | ICD-10-CM | POA: Insufficient documentation

## 2018-01-27 ENCOUNTER — Encounter: Payer: Self-pay | Admitting: Physician Assistant

## 2018-01-27 ENCOUNTER — Ambulatory Visit (INDEPENDENT_AMBULATORY_CARE_PROVIDER_SITE_OTHER): Payer: PRIVATE HEALTH INSURANCE | Admitting: Physician Assistant

## 2018-01-27 VITALS — BP 110/80 | HR 51 | Temp 98.4°F | Ht 63.5 in | Wt 170.0 lb

## 2018-01-27 DIAGNOSIS — Z7689 Persons encountering health services in other specified circumstances: Secondary | ICD-10-CM | POA: Diagnosis not present

## 2018-01-27 DIAGNOSIS — K9041 Non-celiac gluten sensitivity: Secondary | ICD-10-CM | POA: Diagnosis not present

## 2018-01-27 DIAGNOSIS — F320 Major depressive disorder, single episode, mild: Secondary | ICD-10-CM | POA: Diagnosis not present

## 2018-01-27 NOTE — Progress Notes (Signed)
Maureen Butler is a 57 y.o. female here for an establish care visit.  History of Present Illness:   Chief Complaint  Patient presents with  . New Patient (Initial Visit)  . Establish Care   Gluten intolerance -- 3 years ago went on [place] trip, then 1 week later went on cruise, and gut has never been the same since. She put herself on an elimination diet and finds that she no longer can tolerate gluten -- causes severe bloating and pain. Depression -- denies current SI/HI. Currently doing well on Zoloft 50 mg.  Health Maintenance: Immunizations -- states she is UTD Colonoscopy -- August 2018, repeat q 3 years Mammogram -- UTD on mammogram PAP -- most recent Bone Density -- has not had in the past Weight -- Weight: 170 lb (77.1 kg)   Depression screen PHQ 2/9 01/08/2015  Decreased Interest 0  Down, Depressed, Hopeless 0  PHQ - 2 Score 0    No flowsheet data found.   Other providers/specialists: [facility]   Past Medical History:  Diagnosis Date  . Allergy   . Cataract 2011   bilateral eyes  . Chicken pox   . Depression 2005   dx with PMDD initially, but thought more menopausal, was SI/HI, never admitted to [facility], was on Zoloft 100 mg  . Gluten intolerance    3 years ago -- [place] and gut has never been the same since; gas, bloating, abdominal pain, brain fog  . History of colon polyps   . Seasonal allergies      Social History   Socioeconomic History  . Marital status: Divorced    Spouse name: Not on file  . Number of children: Not on file  . Years of education: Not on file  . Highest education level: Not on file  Occupational History  . Occupation: Mental Health Therapist  Social Needs  . Financial resource strain: Not on file  . Food insecurity:    Worry: Not on file    Inability: Not on file  . Transportation needs:    Medical: Not on file    Non-medical: Not on file  Tobacco Use  . Smoking status: Former Smoker    Last  attempt to quit: 06/14/1992    Years since quitting: 25.6  . Smokeless tobacco: Never Used  Substance and Sexual Activity  . Alcohol use: No    Alcohol/week: 0.0 standard drinks  . Drug use: No  . Sexual activity: Yes    Birth control/protection: Abstinence  Lifestyle  . Physical activity:    Days per week: Not on file    Minutes per session: Not on file  . Stress: Not on file  Relationships  . Social connections:    Talks on phone: Not on file    Gets together: Not on file    Attends religious service: Not on file    Active member of club or organization: Not on file    Attends meetings of clubs or organizations: Not on file    Relationship status: Not on file  . Intimate partner violence:    Fear of current or ex partner: Not on file    Emotionally abused: Not on file    Physically abused: Not on file    Forced sexual activity: Not on file  Other Topics Concern  . 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 BREAST ENHANCEMENT SURGERY    . CATARACT EXTRACTION, BILATERAL    . CESAREAN SECTION  1994  . 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 PLACEMENT OF BREAST IMPLANTS  2003    Family History  Problem Relation Age of Onset  . Colon cancer Mother 79       died age 76, late stages, did metastasize  . Alcohol abuse Mother   . Alcohol abuse Father   . Early death Father 69  . Aplastic anemia Father   . Alcohol abuse Sister   . Depression Sister   . Mental illness Sister   . Alcohol abuse Sister   . Depression Sister   . Alcohol abuse Brother   . Depression Brother   . Drug abuse Brother   . Mental illness Brother   . Breast cancer Maternal Grandmother 45  . Alcohol  abuse Maternal Grandfather   . Cancer Maternal Grandfather        Patient unsure of what kind, ?lung  . Alcohol abuse Brother   . Depression Brother   . Drug abuse Brother   . Mental illness Brother   . Esophageal cancer Neg Hx   . Rectal cancer Neg Hx   . Stomach cancer Neg Hx   . Pancreatic cancer Neg Hx     No Known Allergies   Current Medications:   Current Outpatient Medications:  .  sertraline (ZOLOFT) 50 MG tablet, Take 50 mg by mouth daily. , Disp: , Rfl:   Current Facility-Administered Medications:  .  0.9 %  sodium chloride infusion, 500 mL, Intravenous, Continuous, Danis, Estill Cotta III, MD   Review of Systems:   ROS  Vitals:   Vitals:   01/27/18 0905  BP: 110/80  Pulse: (!) 51  Temp: 98.4 F (36.9 C)  TempSrc: Oral  SpO2: 94%  Weight: 170 lb (77.1 kg)  Height: 5' 3.5" (1.613 m)     Body mass index is 29.64 kg/m.  Physical Exam:   Physical Exam  Constitutional: She appears well-developed. She is cooperative.  Non-toxic appearance. She does not have a sickly appearance. She does not appear ill. No distress.  Cardiovascular: Normal rate, regular rhythm, S1 normal, S2 normal, normal heart sounds and normal pulses.  No LE edema  Pulmonary/Chest: Effort normal and breath sounds normal.  Neurological: She is alert. GCS eye subscore is 4. GCS verbal subscore is 5. GCS motor subscore is 6.  Skin: Skin is warm, dry and intact.  Psychiatric: She has a normal mood and affect. Her speech is normal and behavior is normal.  Nursing note and vitals reviewed.   Assessment and Plan:    [name] was seen today for new patient (initial visit) and establish care.  Diagnoses and all orders for this visit:  Encounter to establish care  Depression, major, single episode, mild (HCC) Stable. Denies need for medication refills at this time.  Gluten intolerance Stable with dietary restrictions.  Follow-up for CPE at patient's convenience.   . Reviewed  expectations re: course of current medical issues. . Discussed self-management of symptoms. . Outlined signs and symptoms indicating need for more acute intervention. . Patient verbalized understanding and all questions were answered. . See orders for this visit as documented in  the electronic medical record. . Patient received an After-Visit Summary.   Inda Coke, PA-C

## 2018-01-27 NOTE — Patient Instructions (Signed)
It was great to see you!  Let's follow-up at your convenience for a physical, sooner if you have concerns.  Take care,  Niyam Bisping PA-C  

## 2018-02-06 ENCOUNTER — Encounter: Payer: Self-pay | Admitting: Physician Assistant

## 2018-03-01 DIAGNOSIS — E162 Hypoglycemia, unspecified: Secondary | ICD-10-CM | POA: Insufficient documentation

## 2018-03-01 DIAGNOSIS — Z87891 Personal history of nicotine dependence: Secondary | ICD-10-CM | POA: Insufficient documentation

## 2018-03-01 DIAGNOSIS — F3281 Premenstrual dysphoric disorder: Secondary | ICD-10-CM | POA: Insufficient documentation

## 2018-03-01 NOTE — Progress Notes (Deleted)
I acted as a Education administrator for [employer], PA-C Anselmo Pickler, LPN   Subjective:    Maureen Butler is a 57 y.o. female and is here for a comprehensive physical exam.   HPI  Health Maintenance Due  Topic Date Due  . TETANUS/TDAP  04/21/1981    Acute Concerns:   Chronic Issues:   Health Maintenance: Immunizations -- *** Colonoscopy -- *** Mammogram -- *** PAP -- *** Bone Density -- *** Diet -- *** Caffeine intake -- *** Sleep habits -- *** Exercise -- *** Weight --    Mood -- *** Weight history: Wt Readings from Last 10 Encounters:  01/27/18 170 lb (77.1 kg)  01/20/17 173 lb (78.5 kg)  01/06/17 179 lb (81.2 kg)  01/08/15 173 lb (78.5 kg)  12/10/14 169 lb (76.7 kg)  08/08/14 171 lb (77.6 kg)  11/21/12 185 lb (83.9 kg)   No LMP recorded. Patient is postmenopausal. Period characteristics: Alcohol use: Tobacco use:  Depression screen PHQ 2/9 01/08/2015  Decreased Interest 0  Down, Depressed, Hopeless 0  PHQ - 2 Score 0     Other providers/specialists: ***   PMHx, SurgHx, SocialHx, Medications, and Allergies were reviewed in the Visit Navigator and updated as appropriate.   Past Medical History:  Diagnosis Date  . Allergy   . Cataract 2011   bilateral eyes  . Chicken pox   . Depression 2005   dx with PMDD initially, but thought more menopausal, was SI/HI, never admitted to [facility], was on Zoloft 100 mg  . Gluten intolerance    3 years ago -- went [place] and gut has never been the same since; gas, bloating, abdominal pain, brain fog  . History of colon polyps   . Seasonal allergies     Past Surgical History:  Procedure Laterality Date  . BREAST ENHANCEMENT SURGERY    . CATARACT EXTRACTION, BILATERAL    . CESAREAN SECTION  1994  . 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 PLACEMENT OF BREAST IMPLANTS  2003    Family History  Problem Relation Age of Onset  . Colon cancer Mother 8       died age 48, late stages, did metastasize  .  Alcohol abuse Mother   . Alcohol abuse Father   . Early death Father 76  . Aplastic anemia Father   . Alcohol abuse Sister   . Depression Sister   . Mental illness Sister   . Alcohol abuse Sister   . Depression Sister   . Alcohol abuse Brother   . Depression Brother   . Drug abuse Brother   . Mental illness Brother   . Breast cancer Maternal Grandmother 40  . Alcohol abuse Maternal Grandfather   . Cancer Maternal Grandfather        Patient unsure of what kind, ?lung  . Alcohol abuse Brother   . Depression Brother   . Drug abuse Brother   . Mental illness Brother   . Esophageal cancer Neg Hx   . Rectal cancer Neg Hx   . Stomach cancer Neg Hx   . Pancreatic cancer Neg Hx     Social History   Tobacco Use  . Smoking status: Former Smoker    Last attempt to quit: 06/14/1992    Years since quitting: 25.7  . Smokeless tobacco: Never Used  Substance Use Topics  . Alcohol use: No    Alcohol/week: 0.0 standard drinks  . Drug use: No    Review  of Systems:   ROS  Objective:   There were no vitals taken for this visit. There is no height or weight on file to calculate BMI.   General Appearance:    Alert, cooperative, no distress, appears stated age  Head:    Normocephalic, without obvious abnormality, atraumatic  Eyes:    PERRL, conjunctiva/corneas clear, EOM's intact, fundi    benign, both eyes  Ears:    Normal TM's and external ear canals, both ears  Nose:   Nares normal, septum midline, mucosa normal, no drainage    or sinus tenderness  Throat:   Lips, mucosa, and tongue normal; teeth and gums normal  Neck:   Supple, symmetrical, trachea midline, no adenopathy;    thyroid:  no enlargement/tenderness/nodules; no carotid   bruit or JVD  Back:     Symmetric, no curvature, ROM normal, no CVA tenderness  Lungs:     Clear to auscultation bilaterally, respirations unlabored  Chest Wall:    No tenderness or deformity   Heart:    Regular rate and rhythm, S1 and S2 normal, no  murmur, rub   or gallop  Breast Exam:    No tenderness, masses, or nipple abnormality  Abdomen:     Soft, non-tender, bowel sounds active all four quadrants,    no masses, no organomegaly  Genitalia:    Normal female without lesion, discharge or tenderness  Extremities:   Extremities normal, atraumatic, no cyanosis or edema  Pulses:   2+ and symmetric all extremities  Skin:   Skin color, texture, turgor normal, no rashes or lesions  Lymph nodes:   Cervical, supraclavicular, and axillary nodes normal  Neurologic:   CNII-XII intact, normal strength, sensation and reflexes    throughout    Assessment/Plan:   There are no diagnoses linked to this encounter.   Well Adult Exam: Labs ordered: {Yes/No:18319::"Yes"}. Patient counseling was done. See below for items discussed. Discussed the patient's BMI.  The BMI {BMI plan (MU NQF measure 421):19504} Follow up {follow-up interval:13814}. Breast cancer screening: ***. Cervical cancer screening: ***   Patient Counseling: [x]    Nutrition: Stressed importance of moderation in sodium/caffeine intake, saturated fat and cholesterol, caloric balance, sufficient intake of fresh fruits, vegetables, fiber, calcium, iron, and 1 mg of folate supplement per day (for females capable of pregnancy).  [x]    Stressed the importance of regular exercise.   [x]    Substance Abuse: Discussed cessation/primary prevention of tobacco, alcohol, or other drug use; driving or other dangerous activities under the influence; availability of treatment for abuse.   [x]    Injury prevention: Discussed safety belts, safety helmets, smoke detector, smoking near bedding or upholstery.   [x]    Sexuality: Discussed sexually transmitted diseases, partner selection, use of condoms, avoidance of unintended pregnancy  and contraceptive alternatives.  [x]    Dental health: Discussed importance of regular tooth brushing, flossing, and dental visits.  [x]    Health maintenance and immunizations  reviewed. Please refer to Health maintenance section.   ***  Inda Coke, PA-C [place]

## 2018-03-01 NOTE — Progress Notes (Signed)
Subjective:    Maureen Butler is a 57 y.o. female and is here for a comprehensive physical exam.   HPI  There are no preventive care reminders to display for this patient.  Acute Concerns: None  Chronic Issues: Depression -- Currently on Zoloft 50 mg. Feels stable. Denies SI/HI. Former smoker -- Remains smoke free!  Health Maintenance: Immunizations -- will get flu shot at work Colonoscopy -- August 2018, repeat q 3 years Mammogram -- overdue on mammogram PAP -- most recent 11/09/16 normal Bone Density -- has not had in the past Diet -- gluten-intolerant; avoids lots of processed foods, mostly chicken, no fried foods, no sodas or juices Caffeine intake -- coffee daily Sleep habits -- sleep like a champ Exercise -- teaches at club fitness Weight -- Weight: 170 lb (77.1 kg) , plan is to get 160 lb Mood -- good Weight history: Wt Readings from Last 10 Encounters:  03/02/18 170 lb (77.1 kg)  01/27/18 170 lb (77.1 kg)  01/20/17 173 lb (78.5 kg)  01/06/17 179 lb (81.2 kg)  01/08/15 173 lb (78.5 kg)  12/10/14 169 lb (76.7 kg)  08/08/14 171 lb (77.6 kg)  11/21/12 185 lb (83.9 kg)   No LMP recorded. Patient is postmenopausal. Alcohol use: none Tobacco use: former, quit in 1994  Depression screen PHQ 2/9 03/02/2018  Decreased Interest 0  Down, Depressed, Hopeless 0  PHQ - 2 Score 0   Other providers/specialists: Ob-Gyn   PMHx, SurgHx, SocialHx, Medications, and Allergies were reviewed in the Visit Navigator and updated as appropriate.   Past Medical History:  Diagnosis Date  . Allergy   . Cataract 2011   bilateral eyes  . Chicken pox   . Depression 2005   dx with PMDD initially, but thought more menopausal, was SI/HI, never admitted to [facility], was on Zoloft 100 mg  . Gluten intolerance    3 years ago -- [place] and gut has never been the same since; gas, bloating, abdominal pain, brain fog  . History of colon polyps   . Seasonal allergies       Past Surgical History:  Procedure Laterality Date  . BREAST ENHANCEMENT SURGERY    . CATARACT EXTRACTION, BILATERAL    . CESAREAN SECTION  1994  . 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 PLACEMENT OF BREAST IMPLANTS  2003     Family History  Problem Relation Age of Onset  . Colon cancer Mother 39       died age 37, late stages, did metastasize  . Alcohol abuse Mother   . Alcohol abuse Father   . Early death Father 53  . Aplastic anemia Father   . Alcohol abuse Sister   . Depression Sister   . Mental illness Sister   . Alcohol abuse Sister   . Depression Sister   . Alcohol abuse Brother   . Depression Brother   . Drug abuse Brother   . Mental illness Brother   . Breast cancer Maternal Grandmother 70  . Alcohol abuse Maternal Grandfather   . Cancer Maternal Grandfather        Patient unsure of what kind, ?lung  . Alcohol abuse Brother   . Depression Brother   . Drug abuse Brother   . Mental illness Brother   . Esophageal cancer Neg Hx   . Rectal cancer Neg Hx   . Stomach cancer Neg Hx   . Pancreatic cancer Neg Hx  Social History   Tobacco Use  . Smoking status: Former Smoker    Last attempt to quit: 06/14/1992    Years since quitting: 25.7  . Smokeless tobacco: Never Used  Substance Use Topics  . Alcohol use: No    Alcohol/week: 0.0 standard drinks  . Drug use: No    Review of Systems:   Review of Systems  Constitutional: Negative for chills, fever, malaise/fatigue and weight loss.  HENT: Negative for hearing loss, sinus pain and sore throat.   Respiratory: Negative for cough and hemoptysis.   Cardiovascular: Negative for chest pain, palpitations, leg swelling and PND.  Gastrointestinal: Negative for abdominal pain, constipation, diarrhea, heartburn, nausea and vomiting.  Genitourinary: Negative for dysuria, frequency and urgency.  Musculoskeletal: Negative for back pain, myalgias and neck pain.  Skin: Negative for itching and rash.  Neurological:  Negative for dizziness, tingling, seizures and headaches.  Endo/Heme/Allergies: Negative for polydipsia.  Psychiatric/Behavioral: Negative for depression. The patient is not nervous/anxious.     Objective:   BP 110/70 (BP Location: Left Arm, Patient Position: Sitting, Cuff Size: Normal)   Pulse (!) 51   Temp 97.9 F (36.6 C) (Oral)   Ht 5\' 4"  (1.626 m)   Wt 170 lb (77.1 kg)   SpO2 97%   BMI 29.18 kg/m  Body mass index is 29.18 kg/m.   General Appearance:    Alert, cooperative, no distress, appears stated age  Head:    Normocephalic, without obvious abnormality, atraumatic  Eyes:    PERRL, conjunctiva/corneas clear, EOM's intact, fundi    benign, both eyes  Ears:    Normal TM's and external ear canals, both ears  Nose:   Nares normal, septum midline, mucosa normal, no drainage    or sinus tenderness  Throat:   Lips, mucosa, and tongue normal; teeth and gums normal  Neck:   Supple, symmetrical, trachea midline, no adenopathy;    thyroid:  no enlargement/tenderness/nodules; no carotid   bruit or JVD  Back:     Symmetric, no curvature, ROM normal, no CVA tenderness  Lungs:     Clear to auscultation bilaterally, respirations unlabored  Chest Wall:    No tenderness or deformity   Heart:    Regular rate and rhythm, S1 and S2 normal, no murmur, rub   or gallop  Breast Exam:    Deferred  Abdomen:     Soft, non-tender, bowel sounds active all four quadrants,    no masses, no organomegaly  Genitalia:    Deferred  Extremities:   Extremities normal, atraumatic, no cyanosis or edema  Pulses:   2+ and symmetric all extremities  Skin:   Skin color, texture, turgor normal, no rashes or lesions  Lymph nodes:   Cervical, supraclavicular, and axillary nodes normal  Neurologic:   CNII-XII intact, normal strength, sensation and reflexes    throughout    Assessment/Plan:   Cimone was seen today for annual exam.  Diagnoses and all orders for this visit:  Routine physical  examination Today patient counseled on age appropriate routine health concerns for screening and prevention, each reviewed and up to date or declined. Immunizations reviewed and up to date or declined. Labs ordered and reviewed. Risk factors for depression reviewed and negative. Hearing function and visual acuity are intact. ADLs screened and addressed as needed. Functional ability and level of safety reviewed and appropriate. Education, counseling and referrals performed based on assessed risks today. Patient provided with a copy of personalized plan for preventive services.  Encounter  for lipid screening for cardiovascular disease -     Lipid panel  Former smoker Remains smoke free! -     CBC with Differential/Platelet -     Comprehensive metabolic panel  Depression, major, single episode, mild (HCC) Well controlled. Continue Zoloft 50 mg. Follow-up in 6 months, sooner if needed. -     TSH    Well Adult Exam: Labs ordered: Yes. Patient counseling was done. See below for items discussed. Discussed the patient's BMI.  The BMI BMI is not in the acceptable range; BMI management plan is completed Follow up as needed for acute illness. Breast cancer screening: pending. Cervical cancer screening: UTD   Patient Counseling: [x]    Nutrition: Stressed importance of moderation in sodium/caffeine intake, saturated fat and cholesterol, caloric balance, sufficient intake of fresh fruits, vegetables, fiber, calcium, iron, and 1 mg of folate supplement per day (for females capable of pregnancy).  [x]    Stressed the importance of regular exercise.   [x]    Substance Abuse: Discussed cessation/primary prevention of tobacco, alcohol, or other drug use; driving or other dangerous activities under the influence; availability of treatment for abuse.   [x]    Injury prevention: Discussed safety belts, safety helmets, smoke detector, smoking near bedding or upholstery.   [x]    Sexuality: Discussed sexually transmitted  diseases, partner selection, use of condoms, avoidance of unintended pregnancy  and contraceptive alternatives.  [x]    Dental health: Discussed importance of regular tooth brushing, flossing, and dental visits.  [x]    Health maintenance and immunizations reviewed. Please refer to Health maintenance section.   Inda Coke, PA-C [place]

## 2018-03-02 ENCOUNTER — Ambulatory Visit (INDEPENDENT_AMBULATORY_CARE_PROVIDER_SITE_OTHER): Payer: PRIVATE HEALTH INSURANCE | Admitting: Physician Assistant

## 2018-03-02 ENCOUNTER — Encounter: Payer: Self-pay | Admitting: Physician Assistant

## 2018-03-02 VITALS — BP 110/70 | HR 51 | Temp 97.9°F | Ht 64.0 in | Wt 170.0 lb

## 2018-03-02 DIAGNOSIS — Z0000 Encounter for general adult medical examination without abnormal findings: Secondary | ICD-10-CM | POA: Diagnosis not present

## 2018-03-02 DIAGNOSIS — Z13220 Encounter for screening for lipoid disorders: Secondary | ICD-10-CM

## 2018-03-02 DIAGNOSIS — F320 Major depressive disorder, single episode, mild: Secondary | ICD-10-CM | POA: Insufficient documentation

## 2018-03-02 DIAGNOSIS — Z87891 Personal history of nicotine dependence: Secondary | ICD-10-CM | POA: Diagnosis not present

## 2018-03-02 DIAGNOSIS — Z136 Encounter for screening for cardiovascular disorders: Secondary | ICD-10-CM | POA: Diagnosis not present

## 2018-03-02 LAB — CBC WITH DIFFERENTIAL/PLATELET
BASOS ABS: 0.1 10*3/uL (ref 0.0–0.1)
Basophils Relative: 1.1 % (ref 0.0–3.0)
EOS PCT: 1.9 % (ref 0.0–5.0)
Eosinophils Absolute: 0.1 10*3/uL (ref 0.0–0.7)
HCT: 42.2 % (ref 36.0–46.0)
Hemoglobin: 14.3 g/dL (ref 12.0–15.0)
Lymphocytes Relative: 37.1 % (ref 12.0–46.0)
Lymphs Abs: 2.2 10*3/uL (ref 0.7–4.0)
MCHC: 33.9 g/dL (ref 30.0–36.0)
MCV: 90.2 fl (ref 78.0–100.0)
MONOS PCT: 7.7 % (ref 3.0–12.0)
Monocytes Absolute: 0.5 10*3/uL (ref 0.1–1.0)
NEUTROS ABS: 3.1 10*3/uL (ref 1.4–7.7)
NEUTROS PCT: 52.2 % (ref 43.0–77.0)
Platelets: 308 10*3/uL (ref 150.0–400.0)
RBC: 4.68 Mil/uL (ref 3.87–5.11)
RDW: 13.1 % (ref 11.5–15.5)
WBC: 6 10*3/uL (ref 4.0–10.5)

## 2018-03-02 LAB — COMPREHENSIVE METABOLIC PANEL
ALBUMIN: 4.1 g/dL (ref 3.5–5.2)
ALK PHOS: 88 U/L (ref 39–117)
ALT: 19 U/L (ref 0–35)
AST: 19 U/L (ref 0–37)
BUN: 20 mg/dL (ref 6–23)
CALCIUM: 9.7 mg/dL (ref 8.4–10.5)
CO2: 31 mEq/L (ref 19–32)
Chloride: 105 mEq/L (ref 96–112)
Creatinine, Ser: 0.95 mg/dL (ref 0.40–1.20)
GFR: 64.71 mL/min (ref >60.00)
Glucose, Bld: 93 mg/dL (ref 70–99)
POTASSIUM: 4.6 meq/L (ref 3.5–5.1)
Sodium: 143 mEq/L (ref 135–145)
TOTAL PROTEIN: 6.7 g/dL (ref 6.0–8.3)
Total Bilirubin: 0.4 mg/dL (ref 0.2–1.2)

## 2018-03-02 LAB — LIPID PANEL
CHOLESTEROL: 232 mg/dL — AB (ref 0–200)
HDL: 51.7 mg/dL (ref >39.00)
LDL Cholesterol: 160 mg/dL — ABNORMAL HIGH (ref 0–99)
NonHDL: 179.99
TRIGLYCERIDES: 98 mg/dL (ref 0.0–149.0)
Total CHOL/HDL Ratio: 4
VLDL: 19.6 mg/dL (ref 0.0–40.0)

## 2018-03-02 LAB — TSH: TSH: 1.38 u[IU]/mL (ref 0.35–4.50)

## 2018-03-02 NOTE — Patient Instructions (Addendum)
It was great to see you!  Please go to the lab for blood work.   1. Please get mammogram  Our office will call you with your results unless you have chosen to receive results via MyChart.  If your blood work is normal we will follow-up each year for physicals and as scheduled for chronic medical problems.  If anything is abnormal we will treat accordingly and get you in for a follow-up.  Take care,  [facility] Maintenance, Female Adopting a healthy lifestyle and getting preventive care can go a long way to promote health and wellness. Talk with your health care provider about what schedule of regular examinations is right for you. This is a good chance for you to check in with your provider about disease prevention and staying healthy. In between checkups, there are plenty of things you can do on your own. Experts have done a lot of research about which lifestyle changes and preventive measures are most likely to keep you healthy. Ask your health care provider for more information. Weight and diet Eat a healthy diet  Be sure to include plenty of vegetables, fruits, low-fat dairy products, and lean protein.  Do not eat a lot of foods high in solid fats, added sugars, or salt.  Get regular exercise. This is one of the most important things you can do for your health. ? Most adults should exercise for at least 150 minutes each week. The exercise should increase your heart rate and make you sweat (moderate-intensity exercise). ? Most adults should also do strengthening exercises at least twice a week. This is in addition to the moderate-intensity exercise.  Maintain a healthy weight  Body mass index (BMI) is a measurement that can be used to identify possible weight problems. It estimates body fat based on height and weight. Your health care provider can help determine your BMI and help you achieve or maintain a healthy weight.  For females 60 years of age and older: ? A BMI  below 18.5 is considered underweight. ? A BMI of 18.5 to 24.9 is normal. ? A BMI of 25 to 29.9 is considered overweight. ? A BMI of 30 and above is considered obese.  Watch levels of cholesterol and blood lipids  You should start having your blood tested for lipids and cholesterol at 56 years of age, then have this test every 5 years.  You may need to have your cholesterol levels checked more often if: ? Your lipid or cholesterol levels are high. ? You are older than 56 years of age. ? You are at high risk for heart disease.  Cancer screening Lung Cancer  Lung cancer screening is recommended for adults 48-36 years old who are at high risk for lung cancer because of a history of smoking.  A yearly low-dose CT scan of the lungs is recommended for people who: ? Currently smoke. ? Have quit within the past 15 years. ? Have at least a 30-pack-year history of smoking. A pack year is smoking an average of one pack of cigarettes a day for 1 year.  Yearly screening should continue until it has been 15 years since you quit.  Yearly screening should stop if you develop a health problem that would prevent you from having lung cancer treatment.  Breast Cancer  Practice breast self-awareness. This means understanding how your breasts normally appear and feel.  It also means doing regular breast self-exams. Let your health care provider know about any changes, no  matter how small.  If you are in your 20s or 30s, you should have a clinical breast exam (CBE) by a health care provider every 1-3 years as part of a regular health exam.  If you are 39 or older, have a CBE every year. Also consider having a breast X-ray (mammogram) every year.  If you have a family history of breast cancer, talk to your health care provider about genetic screening.  If you are at high risk for breast cancer, talk to your health care provider about having an MRI and a mammogram every year.  Breast cancer gene  (BRCA) assessment is recommended for women who have family members with BRCA-related cancers. BRCA-related cancers include: ? Breast. ? Ovarian. ? Tubal. ? Peritoneal cancers.  Results of the assessment will determine the need for genetic counseling and BRCA1 and BRCA2 testing.  Cervical Cancer Your health care provider may recommend that you be screened regularly for cancer of the pelvic organs (ovaries, uterus, and vagina). This screening involves a pelvic examination, including checking for microscopic changes to the surface of your cervix (Pap test). You may be encouraged to have this screening done every 3 years, beginning at age 56.  For women ages 50-65, health care providers may recommend pelvic exams and Pap testing every 3 years, or they may recommend the Pap and pelvic exam, combined with testing for human papilloma virus (HPV), every 5 years. Some types of HPV increase your risk of cervical cancer. Testing for HPV may also be done on women of any age with unclear Pap test results.  Other health care providers may not recommend any screening for nonpregnant women who are considered low risk for pelvic cancer and who do not have symptoms. Ask your health care provider if a screening pelvic exam is right for you.  If you have had past treatment for cervical cancer or a condition that could lead to cancer, you need Pap tests and screening for cancer for at least 20 years after your treatment. If Pap tests have been discontinued, your risk factors (such as having a new sexual partner) need to be reassessed to determine if screening should resume. Some women have medical problems that increase the chance of getting cervical cancer. In these cases, your health care provider may recommend more frequent screening and Pap tests.  Colorectal Cancer  This type of cancer can be detected and often prevented.  Routine colorectal cancer screening usually begins at 56 years of age and continues  through 56 years of age.  Your health care provider may recommend screening at an earlier age if you have risk factors for colon cancer.  Your health care provider may also recommend using home test kits to check for hidden blood in the stool.  A small camera at the end of a tube can be used to examine your colon directly (sigmoidoscopy or colonoscopy). This is done to check for the earliest forms of colorectal cancer.  Routine screening usually begins at age 71.  Direct examination of the colon should be repeated every 5-10 years through 56 years of age. However, you may need to be screened more often if early forms of precancerous polyps or small growths are found.  Skin Cancer  Check your skin from head to toe regularly.  Tell your health care provider about any new moles or changes in moles, especially if there is a change in a mole's shape or color.  Also tell your health care provider if you have  a mole that is larger than the size of a pencil eraser.  Always use sunscreen. Apply sunscreen liberally and repeatedly throughout the day.  Protect yourself by wearing long sleeves, pants, a wide-brimmed hat, and sunglasses whenever you are outside.  Heart disease, diabetes, and high blood pressure  High blood pressure causes heart disease and increases the risk of stroke. High blood pressure is more likely to develop in: ? People who have blood pressure in the high end of the normal range (130-139/85-89 mm Hg). ? People who are overweight or obese. ? People who are African American.  If you are 71-9 years of age, have your blood pressure checked every 3-5 years. If you are 11 years of age or older, have your blood pressure checked every year. You should have your blood pressure measured twice-once when you are at a hospital or clinic, and once when you are not at a hospital or clinic. Record the average of the two measurements. To check your blood pressure when you are not at a  hospital or clinic, you can use: ? An automated blood pressure machine at a pharmacy. ? A home blood pressure monitor.  If you are between 69 years and 70 years old, ask your health care provider if you should take aspirin to prevent strokes.  Have regular diabetes screenings. This involves taking a blood sample to check your fasting blood sugar level. ? If you are at a normal weight and have a low risk for diabetes, have this test once every three years after 56 years of age. ? If you are overweight and have a high risk for diabetes, consider being tested at a younger age or more often. Preventing infection Hepatitis B  If you have a higher risk for hepatitis B, you should be screened for this virus. You are considered at high risk for hepatitis B if: ? You were born in a country where hepatitis B is common. Ask your health care provider which countries are considered high risk. ? Your parents were born in a high-risk country, and you have not been immunized against hepatitis B (hepatitis B vaccine). ? You have HIV or AIDS. ? You use needles to inject street drugs. ? You live with someone who has hepatitis B. ? You have had sex with someone who has hepatitis B. ? You get hemodialysis treatment. ? You take certain medicines for conditions, including cancer, organ transplantation, and autoimmune conditions.  Hepatitis C  Blood testing is recommended for: ? Everyone born from [birth year] through 1964. ? Anyone with known risk factors for hepatitis C.  Sexually transmitted infections (STIs)  You should be screened for sexually transmitted infections (STIs) including gonorrhea and chlamydia if: ? You are sexually active and are younger than 56 years of age. ? You are older than 56 years of age and your health care provider tells you that you are at risk for this type of infection. ? Your sexual activity has changed since you were last screened and you are at an increased risk for chlamydia or  gonorrhea. Ask your health care provider if you are at risk.  If you do not have HIV, but are at risk, it may be recommended that you take a prescription medicine daily to prevent HIV infection. This is called pre-exposure prophylaxis (PrEP). You are considered at risk if: ? You are sexually active and do not regularly use condoms or know the HIV status of your partner(s). ? You take drugs by injection. ?  You are sexually active with a partner who has HIV.  Talk with your health care provider about whether you are at high risk of being infected with HIV. If you choose to begin PrEP, you should first be tested for HIV. You should then be tested every 3 months for as long as you are taking PrEP. Pregnancy  If you are premenopausal and you may become pregnant, ask your health care provider about preconception counseling.  If you may become pregnant, take 400 to 800 micrograms (mcg) of folic acid every day.  If you want to prevent pregnancy, talk to your health care provider about birth control (contraception). Osteoporosis and menopause  Osteoporosis is a disease in which the bones lose minerals and strength with aging. This can result in serious bone fractures. Your risk for osteoporosis can be identified using a bone density scan.  If you are 66 years of age or older, or if you are at risk for osteoporosis and fractures, ask your health care provider if you should be screened.  Ask your health care provider whether you should take a calcium or vitamin D supplement to lower your risk for osteoporosis.  Menopause may have certain physical symptoms and risks.  Hormone replacement therapy may reduce some of these symptoms and risks. Talk to your health care provider about whether hormone replacement therapy is right for you. Follow these instructions at home:  Schedule regular health, dental, and eye exams.  Stay current with your immunizations.  Do not use any tobacco products including  cigarettes, chewing tobacco, or electronic cigarettes.  If you are pregnant, do not drink alcohol.  If you are breastfeeding, limit how much and how often you drink alcohol.  Limit alcohol intake to no more than 1 drink per day for nonpregnant women. One drink equals 12 ounces of beer, 5 ounces of wine, or 1 ounces of hard liquor.  Do not use street drugs.  Do not share needles.  Ask your health care provider for help if you need support or information about quitting drugs.  Tell your health care provider if you often feel depressed.  Tell your health care provider if you have ever been abused or do not feel safe at home. This information is not intended to replace advice given to you by your health care provider. Make sure you discuss any questions you have with your health care provider. Document Released: 12/14/2010 Document Revised: 11/06/2015 Document Reviewed: 03/04/2015 Elsevier Interactive Patient Education  [employer].

## 2019-04-02 ENCOUNTER — Other Ambulatory Visit: Payer: Self-pay

## 2019-04-02 DIAGNOSIS — Z20822 Contact with and (suspected) exposure to covid-19: Secondary | ICD-10-CM

## 2019-04-04 LAB — NOVEL CORONAVIRUS, NAA: SARS-CoV-2, NAA: NOT DETECTED

## 2019-05-08 ENCOUNTER — Ambulatory Visit: Payer: PRIVATE HEALTH INSURANCE | Admitting: Physician Assistant

## 2019-05-31 ENCOUNTER — Other Ambulatory Visit: Payer: Self-pay

## 2019-05-31 ENCOUNTER — Ambulatory Visit: Payer: PRIVATE HEALTH INSURANCE | Attending: Internal Medicine

## 2019-05-31 DIAGNOSIS — Z20822 Contact with and (suspected) exposure to covid-19: Secondary | ICD-10-CM

## 2019-06-02 LAB — NOVEL CORONAVIRUS, NAA: SARS-CoV-2, NAA: NOT DETECTED

## 2019-07-12 ENCOUNTER — Other Ambulatory Visit: Payer: Self-pay

## 2019-07-12 ENCOUNTER — Encounter: Payer: Self-pay | Admitting: Physician Assistant

## 2019-07-12 ENCOUNTER — Ambulatory Visit (INDEPENDENT_AMBULATORY_CARE_PROVIDER_SITE_OTHER): Payer: PRIVATE HEALTH INSURANCE | Admitting: Physician Assistant

## 2019-07-12 VITALS — BP 110/80 | HR 54 | Temp 97.9°F | Ht 64.0 in | Wt 175.5 lb

## 2019-07-12 DIAGNOSIS — L989 Disorder of the skin and subcutaneous tissue, unspecified: Secondary | ICD-10-CM

## 2019-07-12 DIAGNOSIS — R21 Rash and other nonspecific skin eruption: Secondary | ICD-10-CM

## 2019-07-12 MED ORDER — TRIAMCINOLONE ACETONIDE 0.1 % EX CREA: TOPICAL_CREAM | CUTANEOUS | 0 refills | Status: DC

## 2019-07-12 NOTE — Patient Instructions (Signed)
It was great to see you!  1. Trial cream to your rash on your chest. If it it makes it worse, please stop it. 2. You will be contacted about your referral to dermatology.  Take care,  Inda Coke PA-C

## 2019-07-12 NOTE — Progress Notes (Signed)
Maureen Butler is a 59 y.o. female here for a new problem.  I acted as a Education administrator for [employer], PA-C [employer], LPN  History of Present Illness:   Chief Complaint  Patient presents with  . skin lesion    HPI   Skin lesion Pt c/o lesion above left eye x 2 yrs and is tender to touch. Has history of scar on her left eye. The area around her scar has tenderness and a new dry patch that is in her eyebrow. She has tried different moisturizers without improvement.   Rash Has rash to her chest that has been intermittent. Got a bad sunburn during the summer and the rash has persisted since. She has sensitive skin at baseline and has tried to use different OTC remedies but without help. The rash is painful but not itchy.    Past Medical History:  Diagnosis Date  . Allergy   . Cataract 2011   bilateral eyes  . Chicken pox   . Depression 2005   dx with PMDD initially, but thought more menopausal, was SI/HI, never admitted to [facility], was on Zoloft 100 mg  . Gluten intolerance    3 years ago -- [place] and gut has never been the same since; gas, bloating, abdominal pain, brain fog  . History of colon polyps   . Seasonal allergies      Social History   Socioeconomic History  . Marital status: Divorced    Spouse name: Not on file  . Number of children: Not on file  . Years of education: Not on file  . Highest education level: Not on file  Occupational History  . Occupation: Mental Health Therapist  Tobacco Use  . Smoking status: Former Smoker    Quit date: 06/14/1992    Years since quitting: 27.0  . Smokeless tobacco: Never Used  Substance and Sexual Activity  . Alcohol use: No    Alcohol/week: 0.0 standard drinks  . Drug use: No  . Sexual activity: Yes    Birth control/protection: Abstinence  Other Topics Concern  . 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 Difficulty of Paying Living Expenses: Not on file  Food Insecurity:   . Worried About Charity fundraiser in the Last Year: Not on file  . Ran Out of Food in the Last Year: Not on file  Transportation Needs:   . Lack of Transportation (Medical): Not on file  . Lack of Transportation (Non-Medical): Not on file  Physical Activity:   . Days of Exercise per Week: Not on file  . Minutes of Exercise per Session: Not on file  Stress:   . Feeling of Stress : Not on file  Social Connections:   . Frequency of Communication with Friends and Family: Not on file  . Frequency of Social Gatherings with Friends and Family: Not on file  . Attends Religious Services: Not on file  . Active Member of Clubs or Organizations: Not on file  . Attends Archivist Meetings: Not on file  . Marital Status: Not on file  Intimate Partner Violence:   .  Fear of Current or Ex-Partner: Not on file  . Emotionally Abused: Not on file  . Physically Abused: Not on file  . Sexually Abused: Not on file    Past Surgical History:  Procedure Laterality Date  . BREAST ENHANCEMENT SURGERY    . CATARACT EXTRACTION, BILATERAL    . CESAREAN SECTION  1994  . 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 PLACEMENT OF BREAST IMPLANTS  2003    Family History  Problem Relation Age of Onset  . Colon cancer Mother 67       died age 69, late stages, did metastasize  . Alcohol abuse Mother   . Alcohol abuse Father   . Early death Father 19  . Aplastic anemia Father   . Alcohol abuse Sister   . Depression Sister   . Mental illness Sister   . Alcohol abuse Sister   . Depression Sister   . Alcohol abuse Brother   . Depression Brother   . Drug abuse Brother   . Mental illness Brother   . Breast cancer  Maternal Grandmother 51  . Alcohol abuse Maternal Grandfather   . Cancer Maternal Grandfather        Patient unsure of what kind, ?lung  . Alcohol abuse Brother   . Depression Brother   . Drug abuse Brother   . Mental illness Brother   . Esophageal cancer Neg Hx   . Rectal cancer Neg Hx   . Stomach cancer Neg Hx   . Pancreatic cancer Neg Hx     No Known Allergies  Current Medications:   Current Outpatient Medications:  Marland Kitchen  Multiple Vitamin (MULTIVITAMIN) tablet, Take 1 tablet by mouth daily., Disp: , Rfl:  .  sertraline (ZOLOFT) 50 MG tablet, Take 50 mg by mouth daily. , Disp: , Rfl:  .  triamcinolone cream (KENALOG) 0.1 %, Apply to affected area 1-2 times daily, Disp: 30 g, Rfl: 0  Current Facility-Administered Medications:  .  0.9 %  sodium chloride infusion, 500 mL, Intravenous, Continuous, Danis, Estill Cotta III, MD   Review of Systems:   ROS  Negative unless otherwise specified per HPI.  Vitals:   Vitals:   07/12/19 0808  BP: 110/80  Pulse: (!) 54  Temp: 97.9 F (36.6 C)  TempSrc: Temporal  SpO2: 98%  Weight: 175 lb 8 oz (79.6 kg)  Height: 5\' 4"  (1.626 m)     Body mass index is 30.12 kg/m.  Physical Exam:   Physical Exam Vitals and nursing note reviewed.  Constitutional:      General: She is not in acute distress.    Appearance: She is well-developed. She is not ill-appearing or toxic-appearing.  HENT:     Head: Normocephalic and atraumatic.  Cardiovascular:     Rate and Rhythm: Normal rate and regular rhythm.     Heart sounds: Normal heart sounds.  Pulmonary:     Effort: Pulmonary effort is normal. No accessory muscle usage or respiratory distress.     Breath sounds: Normal breath sounds.  Skin:    General: Skin is warm and dry.     Comments: Slightly swollen area with TTP above L eyebrow. Small flesh-colored patch in eyebrow.  Multiple scattered, scabbed erythematous papules to central chest  Neurological:     Mental Status: She is alert.   Psychiatric:        Speech: Speech normal.        Behavior: Behavior is cooperative.  Assessment and Plan:   [name] was seen today for skin lesion.  Diagnoses and all orders for this visit:  Skin lesion Unclear etiology. Referral to dermatology for further evaluation. -     Ambulatory referral to Dermatology  Rash and nonspecific skin eruption Unclear etiology. Will trial triamcinolone ointment to see if this helps, recommended discontinue use if it is bothersome. Referral to dermatology for further evaluation. -     Ambulatory referral to Dermatology  Other orders -     triamcinolone cream (KENALOG) 0.1 %; Apply to affected area 1-2 times daily  . Reviewed expectations re: course of current medical issues. . Discussed self-management of symptoms. . Outlined signs and symptoms indicating need for more acute intervention. . Patient verbalized understanding and all questions were answered. . See orders for this visit as documented in the electronic medical record. . Patient received an After-Visit Summary.  CMA or LPN served as scribe during this visit. History, Physical, and Plan performed by medical provider. The above documentation has been reviewed and is accurate and complete.  Inda Coke, PA-C

## 2019-09-25 ENCOUNTER — Encounter: Payer: Self-pay | Admitting: Physician Assistant

## 2019-09-25 MED ORDER — TRIAMCINOLONE ACETONIDE 0.1 % EX CREA: TOPICAL_CREAM | CUTANEOUS | 1 refills | Status: AC

## 2019-09-25 NOTE — Telephone Encounter (Signed)
Okay to refill Triamcinolone cream?

## 2019-11-16 DIAGNOSIS — B078 Other viral warts: Secondary | ICD-10-CM | POA: Diagnosis not present

## 2019-11-16 DIAGNOSIS — L821 Other seborrheic keratosis: Secondary | ICD-10-CM | POA: Diagnosis not present

## 2019-11-16 DIAGNOSIS — L570 Actinic keratosis: Secondary | ICD-10-CM | POA: Diagnosis not present

## 2020-02-01 ENCOUNTER — Ambulatory Visit (INDEPENDENT_AMBULATORY_CARE_PROVIDER_SITE_OTHER): Payer: BC Managed Care – PPO | Admitting: Physician Assistant

## 2020-02-01 ENCOUNTER — Encounter: Payer: Self-pay | Admitting: Physician Assistant

## 2020-02-01 ENCOUNTER — Other Ambulatory Visit: Payer: Self-pay

## 2020-02-01 VITALS — BP 120/80 | HR 58 | Temp 98.1°F | Ht 64.0 in | Wt 173.0 lb

## 2020-02-01 DIAGNOSIS — Z205 Contact with and (suspected) exposure to viral hepatitis: Secondary | ICD-10-CM | POA: Diagnosis not present

## 2020-02-01 DIAGNOSIS — Z1159 Encounter for screening for other viral diseases: Secondary | ICD-10-CM | POA: Diagnosis not present

## 2020-02-01 NOTE — Patient Instructions (Signed)
It was great to see you!  I will be in touch with your labs.  Take care,  Inda Coke PA-C

## 2020-02-01 NOTE — Progress Notes (Signed)
Maureen Butler is a 59 y.o. female here for a new problem.  I acted as a Education administrator for [employer], PA-C [employer], LPN   History of Present Illness:   Chief Complaint  Patient presents with  . exposure to Hep C    HPI   Exposure to Hepatitis C Pt has been exposed to Hepatitis C, partner diagnosed 2 months ago and presently on treatment, he is doing well. He is former IV drug user.  Pt would like Hepatitis C testing.  She denies: current alcohol intake, abdominal pain, unusual weight changes, jaundice, nausea, vomiting, diarrhea.   Past Medical History:  Diagnosis Date  . Allergy   . Cataract 2011   bilateral eyes  . Chicken pox   . Depression 2005   dx with PMDD initially, but thought more menopausal, was SI/HI, never admitted to [facility], was on Zoloft 100 mg  . Gluten intolerance    3 years ago -- [place] and gut has never been the same since; gas, bloating, abdominal pain, brain fog  . History of colon polyps   . Seasonal allergies      Social History   Tobacco Use  . Smoking status: Former Smoker    Quit date: 06/14/1992    Years since quitting: 27.6  . Smokeless tobacco: Never Used  Vaping Use  . Vaping Use: Never used  Substance Use Topics  . Alcohol use: No    Alcohol/week: 0.0 standard drinks  . Drug use: No    Past Surgical History:  Procedure Laterality Date  . BREAST ENHANCEMENT SURGERY    . CATARACT EXTRACTION, BILATERAL    . CESAREAN SECTION  1994  . 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 PLACEMENT OF BREAST IMPLANTS  2003    Family History  Problem Relation Age of Onset  . Colon cancer Mother 76       died age 89, late stages, did metastasize  . Alcohol abuse Mother   . Alcohol abuse Father   . Early death Father 59  . Aplastic anemia Father   . Alcohol abuse Sister   . Depression Sister   . Mental illness Sister   . Alcohol abuse Sister   . Depression Sister   . Alcohol abuse Brother   . Depression Brother   . Drug  abuse Brother   . Mental illness Brother   . Breast cancer Maternal Grandmother 37  . Alcohol abuse Maternal Grandfather   . Cancer Maternal Grandfather        Patient unsure of what kind, ?lung  . Alcohol abuse Brother   . Depression Brother   . Drug abuse Brother   . Mental illness Brother   . Esophageal cancer Neg Hx   . Rectal cancer Neg Hx   . Stomach cancer Neg Hx   . Pancreatic cancer Neg Hx     No Known Allergies  Current Medications:   Current Outpatient Medications:  .  amoxicillin-clavulanate (AUGMENTIN) 875-125 MG tablet, Take 1 tablet by mouth 2 (two) times daily., Disp: , Rfl:  .  Multiple Vitamin (MULTIVITAMIN) tablet, Take 1 tablet by mouth daily., Disp: , Rfl:  .  sertraline (ZOLOFT) 50 MG tablet, Take 50 mg by mouth daily. , Disp: , Rfl:  .  triamcinolone cream (KENALOG) 0.1 %, Apply to affected area 1-2 times daily, Disp: 30 g, Rfl: 1  Current Facility-Administered Medications:  .  0.9 %  sodium chloride infusion, 500 mL, Intravenous,  Continuous, Danis, Kirke Corin, MD   Review of Systems:   ROS  Negative unless otherwise specified per HPI.  Vitals:   Vitals:   02/01/20 0731  BP: 120/80  Pulse: (!) 58  Temp: 98.1 F (36.7 C)  TempSrc: Temporal  SpO2: 98%  Weight: 173 lb (78.5 kg)  Height: 5\' 4"  (1.626 m)     Body mass index is 29.7 kg/m.  Physical Exam:   Physical Exam Vitals and nursing note reviewed.  Constitutional:      General: She is not in acute distress.    Appearance: She is well-developed. She is not ill-appearing or toxic-appearing.  Cardiovascular:     Rate and Rhythm: Normal rate and regular rhythm.     Pulses: Normal pulses.     Heart sounds: Normal heart sounds, S1 normal and S2 normal.     Comments: No LE edema Pulmonary:     Effort: Pulmonary effort is normal.     Breath sounds: Normal breath sounds.  Abdominal:     General: Abdomen is flat. Bowel sounds are normal.     Palpations: Abdomen is soft.     Tenderness:  There is no abdominal tenderness.  Skin:    General: Skin is warm and dry.  Neurological:     Mental Status: She is alert.     GCS: GCS eye subscore is 4. GCS verbal subscore is 5. GCS motor subscore is 6.  Psychiatric:        Speech: Speech normal.        Behavior: Behavior normal. Behavior is cooperative.     Results for orders placed or performed in visit on 05/31/19  Novel Coronavirus, NAA ([facility])   Specimen: Nasopharyngeal(NP) swabs in vial transport medium   NASOPHARYNGE  TESTING  Result Value Ref Range   SARS-CoV-2, NAA Not Detected Not Detected    Assessment and Plan:   [name] was seen today for exposure to hep c.  Diagnoses and all orders for this visit:  Exposure to hepatitis C Asymptomatic. Update labs today and advise further based upon results. UpToDate handout provided on Blood/Bodily Fluid exposure. -     Comprehensive metabolic panel; Future -     Hepatitis C Antibody; Future -     Hepatitis B Surface AntiGEN; Future -     CBC with Differential/Platelet; Future -     HIV Antibody (routine testing w rflx); Future -     HIV Antibody (routine testing w rflx) -     CBC with Differential/Platelet -     Hepatitis B Surface AntiGEN -     Hepatitis C Antibody -     Comprehensive metabolic panel    . Reviewed expectations re: course of current medical issues. . Discussed self-management of symptoms. . Outlined signs and symptoms indicating need for more acute intervention. . Patient verbalized understanding and all questions were answered. . See orders for this visit as documented in the electronic medical record. . Patient received an After-Visit Summary.  CMA or LPN served as scribe during this visit. History, Physical, and Plan performed by medical provider. The above documentation has been reviewed and is accurate and complete.  Inda Coke, PA-C

## 2020-02-04 LAB — COMPREHENSIVE METABOLIC PANEL
AG Ratio: 1.8 (calc) (ref 1.0–2.5)
ALT: 15 U/L (ref 6–29)
AST: 20 U/L (ref 10–35)
Albumin: 4.1 g/dL (ref 3.6–5.1)
Alkaline phosphatase (APISO): 79 U/L (ref 37–153)
BUN: 13 mg/dL (ref 7–25)
CO2: 29 mmol/L (ref 20–32)
Calcium: 9.3 mg/dL (ref 8.6–10.4)
Chloride: 105 mmol/L (ref 98–110)
Creat: 0.85 mg/dL (ref 0.50–1.05)
Globulin: 2.3 g/dL (calc) (ref 1.9–3.7)
Glucose, Bld: 89 mg/dL (ref 65–99)
Potassium: 4.2 mmol/L (ref 3.5–5.3)
Sodium: 141 mmol/L (ref 135–146)
Total Bilirubin: 0.5 mg/dL (ref 0.2–1.2)
Total Protein: 6.4 g/dL (ref 6.1–8.1)

## 2020-02-04 LAB — CBC WITH DIFFERENTIAL/PLATELET
Absolute Monocytes: 486 cells/uL (ref 200–950)
Basophils Absolute: 59 cells/uL (ref 0–200)
Basophils Relative: 1.1 %
Eosinophils Absolute: 81 cells/uL (ref 15–500)
Eosinophils Relative: 1.5 %
HCT: 41.9 % (ref 35.0–45.0)
Hemoglobin: 13.8 g/dL (ref 11.7–15.5)
Lymphs Abs: 2101 cells/uL (ref 850–3900)
MCH: 30 pg (ref 27.0–33.0)
MCHC: 32.9 g/dL (ref 32.0–36.0)
MCV: 91.1 fL (ref 80.0–100.0)
MPV: 9.8 fL (ref 7.5–12.5)
Monocytes Relative: 9 %
Neutro Abs: 2673 cells/uL (ref 1500–7800)
Neutrophils Relative %: 49.5 %
Platelets: 290 10*3/uL (ref 140–400)
RBC: 4.6 10*6/uL (ref 3.80–5.10)
RDW: 12.2 % (ref 11.0–15.0)
Total Lymphocyte: 38.9 %
WBC: 5.4 10*3/uL (ref 3.8–10.8)

## 2020-02-04 LAB — HIV ANTIBODY (ROUTINE TESTING W REFLEX): HIV 1&2 Ab, 4th Generation: NONREACTIVE

## 2020-02-04 LAB — HEPATITIS B SURFACE ANTIGEN: Hepatitis B Surface Ag: NONREACTIVE

## 2020-02-04 LAB — HEPATITIS C ANTIBODY
Hepatitis C Ab: NONREACTIVE
SIGNAL TO CUT-OFF: 0.01 (ref <1.00)

## 2020-02-13 DIAGNOSIS — Z20822 Contact with and (suspected) exposure to covid-19: Secondary | ICD-10-CM | POA: Diagnosis not present

## 2020-03-17 DIAGNOSIS — M1712 Unilateral primary osteoarthritis, left knee: Secondary | ICD-10-CM | POA: Diagnosis not present

## 2020-05-04 DIAGNOSIS — Z20822 Contact with and (suspected) exposure to covid-19: Secondary | ICD-10-CM | POA: Diagnosis not present

## 2020-05-31 DIAGNOSIS — Z20822 Contact with and (suspected) exposure to covid-19: Secondary | ICD-10-CM | POA: Diagnosis not present

## 2020-06-29 DIAGNOSIS — Z20822 Contact with and (suspected) exposure to covid-19: Secondary | ICD-10-CM | POA: Diagnosis not present

## 2020-06-29 DIAGNOSIS — Z03818 Encounter for observation for suspected exposure to other biological agents ruled out: Secondary | ICD-10-CM | POA: Diagnosis not present

## 2020-07-02 DIAGNOSIS — Z20822 Contact with and (suspected) exposure to covid-19: Secondary | ICD-10-CM | POA: Diagnosis not present

## 2020-07-03 DIAGNOSIS — Z20822 Contact with and (suspected) exposure to covid-19: Secondary | ICD-10-CM | POA: Diagnosis not present

## 2020-07-13 DIAGNOSIS — Z20822 Contact with and (suspected) exposure to covid-19: Secondary | ICD-10-CM | POA: Diagnosis not present

## 2020-08-05 ENCOUNTER — Encounter: Payer: Self-pay | Admitting: Gastroenterology

## 2020-08-25 ENCOUNTER — Encounter: Payer: Self-pay | Admitting: Physician Assistant

## 2020-09-15 DIAGNOSIS — Z1231 Encounter for screening mammogram for malignant neoplasm of breast: Secondary | ICD-10-CM | POA: Diagnosis not present

## 2020-09-15 DIAGNOSIS — Z6830 Body mass index (BMI) 30.0-30.9, adult: Secondary | ICD-10-CM | POA: Diagnosis not present

## 2020-09-15 DIAGNOSIS — Z01419 Encounter for gynecological examination (general) (routine) without abnormal findings: Secondary | ICD-10-CM | POA: Diagnosis not present

## 2020-09-15 DIAGNOSIS — Z124 Encounter for screening for malignant neoplasm of cervix: Secondary | ICD-10-CM | POA: Diagnosis not present

## 2020-09-17 ENCOUNTER — Ambulatory Visit (INDEPENDENT_AMBULATORY_CARE_PROVIDER_SITE_OTHER): Payer: BC Managed Care – PPO | Admitting: Physician Assistant

## 2020-09-17 ENCOUNTER — Other Ambulatory Visit: Payer: Self-pay

## 2020-09-17 ENCOUNTER — Encounter: Payer: Self-pay | Admitting: Physician Assistant

## 2020-09-17 VITALS — BP 92/60 | HR 65 | Temp 97.7°F | Ht 64.0 in | Wt 177.0 lb

## 2020-09-17 DIAGNOSIS — E669 Obesity, unspecified: Secondary | ICD-10-CM | POA: Diagnosis not present

## 2020-09-17 MED ORDER — SAXENDA 18 MG/3ML ~~LOC~~ SOPN: PEN_INJECTOR | SUBCUTANEOUS | 0 refills | Status: DC

## 2020-09-17 NOTE — Patient Instructions (Signed)
It was great to see you!  Start Saxenda  Initial dosage: 0.6 mg once daily for 1 week; increase by 0.6 mg daily at weekly intervals to a target dose of 3 mg once daily. If the patient cannot tolerate an increased dose during dose escalation, consider delaying dose escalation for 1 additional week.   Send me a message after one week if you'd like for [place] to prescribe it.  Take care,  Inda Coke PA-C

## 2020-09-17 NOTE — Addendum Note (Signed)
Addended by: Marian Sorrow on: 09/17/2020 03:20 PM   Modules accepted: Orders

## 2020-09-17 NOTE — Progress Notes (Signed)
Maureen Butler is a 60 y.o. female is here to discuss: Weight loss medications  I acted as a Education administrator for [employer], PA-C Anselmo Pickler, LPN   History of Present Illness:   Chief Complaint  Patient presents with  . Weight Loss    HPI   Obesity Has been struggling with her weight since she was young. Did Weight Watchers in earlier years. 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 Her sister recently started saxenda and has lost around 30 lb on this. She would like her weight to be closer to 155-160 lb.   Past efforts: Calorie counting Weight watchers  She is not a candidate for metformin or phentermine in my professional opinion  Wt Readings from Last 4 Encounters:  09/17/20 177 lb (80.3 kg)  02/01/20 173 lb (78.5 kg)  07/12/19 175 lb 8 oz (79.6 kg)  03/02/18 170 lb (77.1 kg)      Health Maintenance Due  Topic Date Due  . MAMMOGRAM  11/05/2018  . TETANUS/TDAP  10/14/2019  . PAP SMEAR-Modifier  11/05/2019    Past Medical History:  Diagnosis Date  . Allergy   . Cataract 2011   bilateral eyes  . Chicken pox   . Depression 2005   dx with PMDD initially, but thought more menopausal, was SI/HI, never admitted to [facility], was on Zoloft 100 mg  . Gluten intolerance    3 years ago -- went [place] and gut has never been the same since; gas, bloating, abdominal pain, brain fog  . History of colon polyps   . Seasonal allergies      Social History   Tobacco Use  . Smoking status: Former Smoker    Quit date: 06/14/1992    Years since quitting: 28.2  . Smokeless tobacco: Never Used  Vaping Use  . Vaping Use: Never used  Substance Use Topics  . Alcohol use: No    Alcohol/week: 0.0 standard drinks  . Drug use: No    Past Surgical History:  Procedure Laterality Date  . BREAST ENHANCEMENT SURGERY    . CATARACT EXTRACTION, BILATERAL    . CESAREAN SECTION  1994  . 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  PLACEMENT OF BREAST IMPLANTS  2003    Family History  Problem Relation Age of Onset  . Colon cancer Mother 81       died age 66, late stages, did metastasize  . Alcohol abuse Mother   . Alcohol abuse Father   . Early death Father 13  . Aplastic anemia Father   . Alcohol abuse Sister   . Depression Sister   . Mental illness Sister   . Alcohol abuse Sister   . Depression Sister   . Alcohol abuse Brother   . Depression Brother   . Drug abuse Brother   . Mental illness Brother   . Breast cancer Maternal Grandmother 77  . Alcohol abuse Maternal Grandfather   . Cancer Maternal Grandfather        Patient unsure of what kind, ?lung  . Alcohol abuse Brother   . Depression Brother   . Drug abuse Brother   . Mental illness Brother   . Esophageal cancer Neg Hx   . Rectal cancer Neg Hx   . Stomach cancer Neg Hx   . Pancreatic cancer Neg Hx     PMHx, SurgHx, SocialHx, FamHx, Medications, and Allergies were reviewed in the Visit  Navigator and updated as appropriate.   Patient Active Problem List   Diagnosis Date Noted  . Premenstrual dysphoric disorder 03/01/2018  . Former smoker 03/01/2018  . Polyp of colon 02/09/2017  . 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 Smoking status: Former Smoker    Quit date: 06/14/1992    Years since quitting: 28.2  . Smokeless tobacco: Never Used  Vaping Use  . Vaping Use: Never used  Substance Use Topics  . Alcohol use: No    Alcohol/week: 0.0 standard drinks  . Drug use: No    Current Medications and Allergies:    Current Outpatient Medications:  Marland Kitchen  Multiple Vitamin (MULTIVITAMIN) tablet, Take 1 tablet by mouth daily., Disp: , Rfl:  .  sertraline (ZOLOFT) 50 MG tablet, Take 50 mg by mouth daily. , Disp: , Rfl:  .  triamcinolone cream (KENALOG) 0.1 %, Apply to affected area 1-2 times daily, Disp: 30 g, Rfl: 1  Current Facility-Administered Medications:  .  0.9 %  sodium chloride  infusion, 500 mL, Intravenous, Continuous, Danis, Estill Cotta III, MD  No Known Allergies  Review of Systems   ROS Negative unless otherwise specified per HPI.  Vitals:   Vitals:   09/17/20 0751  BP: 92/60  Pulse: 65  Temp: 97.7 F (36.5 C)  SpO2: 95%  Weight: 177 lb (80.3 kg)  Height: 5\' 4"  (1.626 m)     Body mass index is 30.38 kg/m.   Physical Exam:    Physical Exam Vitals and nursing note reviewed.  Constitutional:      General: She is not in acute distress.    Appearance: She is well-developed. She is not ill-appearing or toxic-appearing.  Cardiovascular:     Rate and Rhythm: Normal rate and regular rhythm.     Pulses: Normal pulses.     Heart sounds: Normal heart sounds, S1 normal and S2 normal.     Comments: No LE edema Pulmonary:     Effort: Pulmonary effort is normal.     Breath sounds: Normal breath sounds.  Skin:    General: Skin is warm and dry.  Neurological:     Mental Status: She is alert.     GCS: GCS eye subscore is 4. GCS verbal subscore is 5. GCS motor subscore is 6.  Psychiatric:        Speech: Speech normal.        Behavior: Behavior normal. Behavior is cooperative.      Assessment and Plan:    [name] was seen today for weight loss.  Diagnoses and all orders for this visit:  Obesity, unspecified classification, unspecified obesity type, unspecified whether serious comorbidity present   Start saxenda. Sample provided. Reviewed side effects. She is agreeable to starting this and we discussed how to increase. I have asked her to send me a message one week after starting if she would like for [place] to prescribe this for her.  CMA or LPN served as scribe during this visit. History, Physical, and Plan performed by medical provider. The above documentation has been reviewed and is accurate and complete.  Inda Coke, PA-C [place] [place] 09/17/2020  Follow-up: No follow-ups on file.

## 2020-09-22 ENCOUNTER — Encounter: Payer: Self-pay | Admitting: Physician Assistant

## 2020-09-25 ENCOUNTER — Other Ambulatory Visit: Payer: Self-pay | Admitting: Physician Assistant

## 2020-09-25 MED ORDER — SAXENDA 18 MG/3ML ~~LOC~~ SOPN: PEN_INJECTOR | SUBCUTANEOUS | 5 refills | Status: DC

## 2020-09-25 MED ORDER — NOVOFINE PLUS PEN NEEDLE 32G X 4 MM MISC: 1.0000 | Freq: Every day | 5 refills | Status: AC

## 2020-09-30 ENCOUNTER — Other Ambulatory Visit: Payer: Self-pay | Admitting: Physician Assistant

## 2020-10-01 NOTE — Telephone Encounter (Signed)
Left message on voicemail to call office.  

## 2020-10-10 ENCOUNTER — Ambulatory Visit (AMBULATORY_SURGERY_CENTER): Payer: Self-pay | Admitting: *Deleted

## 2020-10-10 ENCOUNTER — Other Ambulatory Visit: Payer: Self-pay

## 2020-10-10 VITALS — Ht 64.0 in | Wt 170.8 lb

## 2020-10-10 DIAGNOSIS — Z86010 Personal history of colonic polyps: Secondary | ICD-10-CM

## 2020-10-10 MED ORDER — SUPREP BOWEL PREP KIT 17.5-3.13-1.6 GM/177ML PO SOLN: 1.0000 | Freq: Once | ORAL | 0 refills | Status: AC

## 2020-10-10 NOTE — Progress Notes (Signed)
  No trouble with anesthesia, denies being told they were difficult to intubate, or hx/fam hx of malignant hyperthermia per pt   No egg or soy allergy  No home oxygen use   No medications for weight loss taken Pt denies constipation issues  Pt informed that we do not do prior authorizations for prep Suprep given per DO

## 2020-10-24 ENCOUNTER — Encounter: Payer: Self-pay | Admitting: Gastroenterology

## 2020-10-24 ENCOUNTER — Ambulatory Visit (AMBULATORY_SURGERY_CENTER): Payer: BC Managed Care – PPO | Admitting: Gastroenterology

## 2020-10-24 ENCOUNTER — Other Ambulatory Visit: Payer: Self-pay

## 2020-10-24 VITALS — BP 107/63 | HR 58 | Temp 97.8°F | Resp 13 | Ht 64.0 in | Wt 170.8 lb

## 2020-10-24 DIAGNOSIS — Z1211 Encounter for screening for malignant neoplasm of colon: Secondary | ICD-10-CM | POA: Diagnosis not present

## 2020-10-24 DIAGNOSIS — Z86010 Personal history of colonic polyps: Secondary | ICD-10-CM

## 2020-10-24 MED ORDER — SODIUM CHLORIDE 0.9 % IV SOLN: 500.0000 mL | Freq: Once | INTRAVENOUS | Status: DC

## 2020-10-24 NOTE — Progress Notes (Signed)
Vitals by CW 

## 2020-10-24 NOTE — Op Note (Signed)
[place] Patient Name: Maureen Butler Procedure Date: 10/24/2020 10:03 AM MRN: [id number] Endoscopist: [place]. Loletha Carrow , MD Age: 60 Referring MD:  Date of Birth: Sep 04, 1960 Gender: Female Account #: [id number] Procedure:                Colonoscopy Indications:              Surveillance: Personal history of adenomatous                            polyps on last colonoscopy > 3 years ago                           72mm TA 01/2017 on first colonoscopy Medicines:                Monitored Anesthesia Care Procedure:                Pre-Anesthesia Assessment:                           - Prior to the procedure, a History and Physical                            was performed, and patient medications and                            allergies were reviewed. The patient's tolerance of                            previous anesthesia was also reviewed. The risks                            and benefits of the procedure and the sedation                            options and risks were discussed with the patient.                            All questions were answered, and informed consent                            was obtained. Prior Anticoagulants: The patient has                            taken no previous anticoagulant or antiplatelet                            agents. ASA Grade Assessment: II - A patient with                            mild systemic disease. After reviewing the risks                            and benefits, the patient was deemed in  satisfactory condition to undergo the procedure.                           After obtaining informed consent, the colonoscope                            was passed under direct vision. Throughout the                            procedure, the patient's blood pressure, pulse, and                            oxygen saturations were monitored continuously. The                            Olympus [id number]-[id number] (#[id number])  Colonoscope was                            introduced through the anus and advanced to the the                            cecum, identified by appendiceal orifice and                            ileocecal valve. The colonoscopy was performed                            without difficulty. The patient tolerated the                            procedure well. The quality of the bowel                            preparation was excellent. The ileocecal valve,                            appendiceal orifice, and rectum were photographed. Scope In: 10:49:19 AM Scope Out: 11:01:36 AM Scope Withdrawal Time: 0 hours 9 minutes 10 seconds  Total Procedure Duration: 0 hours 12 minutes 17 seconds  Findings:                 The perianal and digital rectal examinations were                            normal.                           The entire examined colon appeared normal on direct                            and retroflexion views. Complications:            No immediate complications. Estimated Blood Loss:     Estimated blood loss: none. Impression:               - The entire examined colon is normal on  direct and                            retroflexion views.                           - No specimens collected. Recommendation:           - Patient has a contact number available for                            emergencies. The signs and symptoms of potential                            delayed complications were discussed with the                            patient. Return to normal activities tomorrow.                            Written discharge instructions were provided to the                            patient.                           - Resume previous diet.                           - Continue present medications.                           - Repeat colonoscopy in 5 years for screening                            purposes due to family history of colon cancer in                             mother. Sadie Pickar L. Loletha Carrow, MD 10/24/2020 11:05:58 AM This report has been signed electronically.

## 2020-10-24 NOTE — Patient Instructions (Signed)
YOU HAD AN ENDOSCOPIC PROCEDURE TODAY AT THE [facility]:   Refer to the procedure report that was given to you for any specific questions about what was found during the examination.  If the procedure report does not answer your questions, please call your gastroenterologist to clarify.  If you requested that your care partner not be given the details of your procedure findings, then the procedure report has been included in a sealed envelope for you to review at your convenience later. ° °YOU SHOULD EXPECT: Some feelings of bloating in the abdomen. Passage of more gas than usual.  Walking can help get rid of the air that was put into your GI tract during the procedure and reduce the bloating. If you had a lower endoscopy (such as a colonoscopy or flexible sigmoidoscopy) you may notice spotting of blood in your stool or on the toilet paper. If you underwent a bowel prep for your procedure, you may not have a normal bowel movement for a few days. ° °Please Note:  You might notice some irritation and congestion in your nose or some drainage.  This is from the oxygen used during your procedure.  There is no need for concern and it should clear up in a day or so. ° °SYMPTOMS TO REPORT IMMEDIATELY: ° °Following lower endoscopy (colonoscopy or flexible sigmoidoscopy): ° Excessive amounts of blood in the stool ° Significant tenderness or worsening of abdominal pains ° Swelling of the abdomen that is new, acute ° Fever of 100°F or higher ° °For urgent or emergent issues, a gastroenterologist can be reached at any hour by calling [phone]. °Do not use MyChart messaging for urgent concerns.  ° ° °DIET:  We do recommend a small meal at first, but then you may proceed to your regular diet.  Drink plenty of fluids but you should avoid alcoholic beverages for 24 hours. ° °ACTIVITY:  You should plan to take it easy for the rest of today and you should NOT DRIVE or use heavy machinery until tomorrow (because of  the sedation medicines used during the test).   ° °FOLLOW UP: °Our staff will call the number listed on your records 48-72 hours following your procedure to check on you and address any questions or concerns that you may have regarding the information given to you following your procedure. If we do not reach you, we will leave a message.  We will attempt to reach you two times.  During this call, we will ask if you have developed any symptoms of COVID 19. If you develop any symptoms (ie: fever, flu-like symptoms, shortness of breath, cough etc.) before then, please call [phone].  If you test positive for Covid 19 in the 2 weeks post procedure, please call and report this information to us.   ° °SIGNATURES/CONFIDENTIALITY: °You and/or your care partner have signed paperwork which will be entered into your electronic medical record.  These signatures attest to the fact that that the information above on your After Visit Summary has been reviewed and is understood.  Full responsibility of the confidentiality of this discharge information lies with you and/or your care-partner.  °

## 2020-10-24 NOTE — Progress Notes (Signed)
pt tolerated well. VSS. awake and to recovery. Report given to RN.  

## 2020-10-28 ENCOUNTER — Telehealth: Payer: Self-pay | Admitting: *Deleted

## 2020-10-28 NOTE — Telephone Encounter (Signed)
  Follow up Call-  Call back number 10/24/2020  Post procedure Call Back phone  # [phone]  Permission to leave phone message Yes  Some recent data might be hidden     Patient questions:  Message left to call us if necessary.  Second call.

## 2020-10-28 NOTE — Telephone Encounter (Signed)
  Follow up Call-  Call back number 10/24/2020  Post procedure Call Back phone  # [phone]  Permission to leave phone message Yes  Some recent data might be hidden     First attempt for follow up phone call. No answer at number given.  Left message on voicemail.

## 2020-11-03 ENCOUNTER — Encounter: Payer: Self-pay | Admitting: Physician Assistant

## 2020-12-03 DIAGNOSIS — Z20822 Contact with and (suspected) exposure to covid-19: Secondary | ICD-10-CM | POA: Diagnosis not present

## 2020-12-30 DIAGNOSIS — Z03818 Encounter for observation for suspected exposure to other biological agents ruled out: Secondary | ICD-10-CM | POA: Diagnosis not present

## 2020-12-30 DIAGNOSIS — Z20822 Contact with and (suspected) exposure to covid-19: Secondary | ICD-10-CM | POA: Diagnosis not present

## 2021-06-24 DIAGNOSIS — E663 Overweight: Secondary | ICD-10-CM | POA: Diagnosis not present

## 2021-06-24 DIAGNOSIS — Z713 Dietary counseling and surveillance: Secondary | ICD-10-CM | POA: Diagnosis not present

## 2021-06-24 DIAGNOSIS — Z13228 Encounter for screening for other metabolic disorders: Secondary | ICD-10-CM | POA: Diagnosis not present

## 2021-06-24 DIAGNOSIS — Z7182 Exercise counseling: Secondary | ICD-10-CM | POA: Diagnosis not present

## 2021-06-24 DIAGNOSIS — Z6829 Body mass index (BMI) 29.0-29.9, adult: Secondary | ICD-10-CM | POA: Diagnosis not present

## 2021-08-26 DIAGNOSIS — L821 Other seborrheic keratosis: Secondary | ICD-10-CM | POA: Diagnosis not present

## 2021-08-26 DIAGNOSIS — D225 Melanocytic nevi of trunk: Secondary | ICD-10-CM | POA: Diagnosis not present

## 2021-08-26 DIAGNOSIS — L814 Other melanin hyperpigmentation: Secondary | ICD-10-CM | POA: Diagnosis not present

## 2021-08-26 DIAGNOSIS — L570 Actinic keratosis: Secondary | ICD-10-CM | POA: Diagnosis not present

## 2021-12-06 ENCOUNTER — Telehealth: Payer: BC Managed Care – PPO | Admitting: Physician Assistant

## 2021-12-06 DIAGNOSIS — R197 Diarrhea, unspecified: Secondary | ICD-10-CM | POA: Diagnosis not present

## 2021-12-06 DIAGNOSIS — R112 Nausea with vomiting, unspecified: Secondary | ICD-10-CM

## 2021-12-07 MED ORDER — ONDANSETRON HCL 4 MG PO TABS: 4.0000 mg | ORAL_TABLET | Freq: Three times a day (TID) | ORAL | 0 refills | Status: AC | PRN

## 2021-12-28 DIAGNOSIS — Z1231 Encounter for screening mammogram for malignant neoplasm of breast: Secondary | ICD-10-CM | POA: Diagnosis not present

## 2021-12-28 DIAGNOSIS — Z01419 Encounter for gynecological examination (general) (routine) without abnormal findings: Secondary | ICD-10-CM | POA: Diagnosis not present

## 2022-03-01 DIAGNOSIS — D225 Melanocytic nevi of trunk: Secondary | ICD-10-CM | POA: Diagnosis not present

## 2022-03-01 DIAGNOSIS — L570 Actinic keratosis: Secondary | ICD-10-CM | POA: Diagnosis not present

## 2022-03-01 DIAGNOSIS — L814 Other melanin hyperpigmentation: Secondary | ICD-10-CM | POA: Diagnosis not present

## 2022-03-08 ENCOUNTER — Encounter: Payer: Self-pay | Admitting: *Deleted

## 2022-05-27 ENCOUNTER — Encounter: Payer: Self-pay | Admitting: *Deleted

## 2024-03-23 ENCOUNTER — Ambulatory Visit: Admitting: Plastic Surgery
